# Patient Record
Sex: Female | Born: 1990 | Race: Black or African American | Hispanic: No | Marital: Single | Smoking: Former smoker
Health system: Southern US, Community
[De-identification: ages and names within clinical notes are randomized; demographics above are authoritative.]

## PROBLEM LIST (undated history)

## (undated) ENCOUNTER — Inpatient Hospital Stay (HOSPITAL_COMMUNITY): Payer: Self-pay

## (undated) DIAGNOSIS — K589 Irritable bowel syndrome without diarrhea: Secondary | ICD-10-CM

## (undated) HISTORY — PX: NO PAST SURGERIES: SHX2092

---
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
## 2013-01-04 ENCOUNTER — Other Ambulatory Visit: Payer: Self-pay | Admitting: Family Medicine

## 2013-01-04 DIAGNOSIS — Q2734 Arteriovenous malformation of renal vessel: Secondary | ICD-10-CM

## 2013-01-05 ENCOUNTER — Other Ambulatory Visit: Payer: Self-pay | Admitting: Family Medicine

## 2013-01-05 ENCOUNTER — Inpatient Hospital Stay
Admission: RE | Admit: 2013-01-05 | Discharge: 2013-01-05 | Disposition: A | Discharge status: Home / Self Care | Payer: Self-pay | Source: Ambulatory Visit | Attending: Family Medicine | Admitting: Family Medicine

## 2013-01-05 DIAGNOSIS — Q2734 Arteriovenous malformation of renal vessel: Secondary | ICD-10-CM

## 2015-02-24 ENCOUNTER — Emergency Department (HOSPITAL_COMMUNITY): Payer: Managed Care, Other (non HMO)

## 2015-02-24 ENCOUNTER — Emergency Department (HOSPITAL_COMMUNITY)
Admission: EM | Admit: 2015-02-24 | Discharge: 2015-02-24 | Disposition: A | Discharge status: Home / Self Care | Payer: Managed Care, Other (non HMO) | Attending: Emergency Medicine | Admitting: Emergency Medicine

## 2015-02-24 ENCOUNTER — Encounter (HOSPITAL_COMMUNITY): Payer: Self-pay

## 2015-02-24 DIAGNOSIS — R42 Dizziness and giddiness: Secondary | ICD-10-CM | POA: Diagnosis not present

## 2015-02-24 DIAGNOSIS — R102 Pelvic and perineal pain: Secondary | ICD-10-CM | POA: Diagnosis not present

## 2015-02-24 DIAGNOSIS — M545 Low back pain: Secondary | ICD-10-CM | POA: Diagnosis not present

## 2015-02-24 DIAGNOSIS — Z3202 Encounter for pregnancy test, result negative: Secondary | ICD-10-CM | POA: Insufficient documentation

## 2015-02-24 DIAGNOSIS — N941 Unspecified dyspareunia: Secondary | ICD-10-CM | POA: Insufficient documentation

## 2015-02-24 DIAGNOSIS — R110 Nausea: Secondary | ICD-10-CM | POA: Diagnosis not present

## 2015-02-24 DIAGNOSIS — R350 Frequency of micturition: Secondary | ICD-10-CM | POA: Insufficient documentation

## 2015-02-24 DIAGNOSIS — Z720 Tobacco use: Secondary | ICD-10-CM | POA: Diagnosis not present

## 2015-02-24 DIAGNOSIS — R1030 Lower abdominal pain, unspecified: Secondary | ICD-10-CM | POA: Diagnosis present

## 2015-02-24 LAB — COMPREHENSIVE METABOLIC PANEL
ALT: 13 U/L — ABNORMAL LOW (ref 14–54)
AST: 20 U/L (ref 15–41)
Albumin: 3.8 g/dL (ref 3.5–5.0)
Alkaline Phosphatase: 57 U/L (ref 38–126)
Anion gap: 7 (ref 5–15)
BUN: 10 mg/dL (ref 6–20)
CO2: 23 mmol/L (ref 22–32)
Calcium: 9.1 mg/dL (ref 8.9–10.3)
Chloride: 103 mmol/L (ref 101–111)
Creatinine, Ser: 0.64 mg/dL (ref 0.44–1.00)
GFR calc Af Amer: >60 mL/min (ref >60)
GFR calc non Af Amer: >60 mL/min (ref >60)
Glucose, Bld: 94 mg/dL (ref 65–99)
Potassium: 4.1 mmol/L (ref 3.5–5.1)
Sodium: 133 mmol/L — ABNORMAL LOW (ref 135–145)
Total Bilirubin: 0.7 mg/dL (ref 0.3–1.2)
Total Protein: 6.8 g/dL (ref 6.5–8.1)

## 2015-02-24 LAB — URINALYSIS, ROUTINE W REFLEX MICROSCOPIC
Bilirubin Urine: NEGATIVE
Glucose, UA: NEGATIVE mg/dL
Hgb urine dipstick: NEGATIVE
Ketones, ur: NEGATIVE mg/dL
Leukocytes, UA: NEGATIVE
Nitrite: NEGATIVE
Protein, ur: NEGATIVE mg/dL
Specific Gravity, Urine: 1.022 (ref 1.005–1.030)
Urobilinogen, UA: 0.2 mg/dL (ref 0.0–1.0)
pH: 6.5 (ref 5.0–8.0)

## 2015-02-24 LAB — WET PREP, GENITAL
TRICH WET PREP: NONE SEEN
YEAST WET PREP: NONE SEEN

## 2015-02-24 LAB — CBC
HCT: 39.5 % (ref 36.0–46.0)
Hemoglobin: 13 g/dL (ref 12.0–15.0)
MCH: 28 pg (ref 26.0–34.0)
MCHC: 32.9 g/dL (ref 30.0–36.0)
MCV: 85.1 fL (ref 78.0–100.0)
Platelets: 244 10*3/uL (ref 150–400)
RBC: 4.64 MIL/uL (ref 3.87–5.11)
RDW: 13.5 % (ref 11.5–15.5)
WBC: 6.9 10*3/uL (ref 4.0–10.5)

## 2015-02-24 LAB — POC URINE PREG, ED: Preg Test, Ur: NEGATIVE

## 2015-02-24 MED ORDER — KETOROLAC TROMETHAMINE 60 MG/2ML IM SOLN
30.0000 mg | Freq: Once | INTRAMUSCULAR | Status: AC
  Administered 2015-02-24: 30 mg via INTRAMUSCULAR
  Filled 2015-02-24: qty 2

## 2015-02-24 MED ORDER — ONDANSETRON 4 MG PO TBDP
4.0000 mg | ORAL_TABLET | Freq: Once | ORAL | Status: AC
Start: 2015-02-24 — End: 2015-02-24
  Administered 2015-02-24: 4 mg via ORAL
  Filled 2015-02-24: qty 1

## 2015-02-24 MED ORDER — PROMETHAZINE HCL 12.5 MG PO TABS: 12.5000 mg | ORAL_TABLET | Freq: Four times a day (QID) | ORAL | Status: DC | PRN

## 2015-02-24 MED ORDER — NAPROXEN 500 MG PO TABS: 500.0000 mg | ORAL_TABLET | Freq: Two times a day (BID) | ORAL | Status: DC

## 2015-02-24 NOTE — ED Notes (Signed)
The pt has been set-up for a pelvic

## 2015-02-24 NOTE — Discharge Instructions (Signed)
[facility] follow up.

## 2015-02-24 NOTE — ED Notes (Signed)
Pt up to the br urine collected and sent to lab  She is desperately attempting to get in touch with her boyfriend that she cannot locate

## 2015-02-24 NOTE — ED Notes (Signed)
[facility] and reports having lower pelvic pain since the insertion.  In the last month she has had increased pain and also having pain during sex sharp on her lt. Side.  Pt. Denies any v/d  .  Pt. Does have periods of intermittent nausea denies any at the present time.  Pt. Denies any vaginal bleeding or unusual discharge or odor. LMP was October 15th  201 6

## 2015-02-24 NOTE — ED Notes (Signed)
The pt is also c/o a headache  Hx of the same she took tylenol this am    Minimal relief

## 2015-02-24 NOTE — ED Notes (Signed)
She feels better 

## 2015-02-24 NOTE — ED Provider Notes (Signed)
CSN: [id number]   Arrival date & time 02/24/15 1447  History  By signing my name below, I, Bethel Born, attest that this documentation has been prepared under the direction and in the presence of Kerrie Buffalo, NP. Electronically Signed: Bethel Born, ED Scribe. 02/24/2015. 3:48 PM. Chief Complaint  Patient presents with  . Abdominal Pain    HPI Patient is a 24 y.o. female presenting with abdominal pain. The history is provided by the patient.  Abdominal Pain Pain location:  Suprapubic Pain quality: cramping   Pain radiates to:  Does not radiate Pain severity:  Severe Onset quality:  Gradual Duration: almost 1 year. Timing:  Constant Progression:  Worsening Relieved by:  Acetaminophen Ineffective treatments:  None tried Associated symptoms: nausea   Associated symptoms: no chills, no diarrhea, no dysuria, no fever, no hematemesis, no hematochezia, no hematuria, no melena, no shortness of breath, no sore throat, no vaginal bleeding, no vaginal discharge and no vomiting   Risk factors: not elderly, not obese and not pregnant    Lori Payne is a 24 y.o. female who presents to the Emergency Department complaining of worsening lower abdominal pain with onset nearly 1 year ago. The pain worsened in the last couple months. The pain is described as cramping like contractions and rated 7/10 in severity. Tylenol provided some pain relief at home.  Associated symptoms include lower back pain, dyspareunia, nausea, light-headedness, and increased urinary frequency. Pt denies vaginal discharge or bleeding, vomiting, fever, chills, dysuria. G1P1. Pt has had a Mirena IUD for 2 years. She notes having near-daily migraines since the IUD was placed in [place]. Her last pap smear was 4 months ago in [place]. At that time it was noted that the IUD was slightly turned. Pt states that she has been able to feel the strings on the device.  History reviewed. No pertinent past medical history.  History  reviewed. No pertinent past surgical history.  No family history on file.  Social History  Substance Use Topics  . Smoking status: Current Every Day Smoker    Types: Cigarettes  . Smokeless tobacco: None  . Alcohol Use: Yes     Comment: social     Review of Systems  Constitutional: Negative for fever and chills.  HENT: Negative for sore throat.   Respiratory: Negative for shortness of breath.   Gastrointestinal: Positive for nausea and abdominal pain. Negative for vomiting, diarrhea, melena, hematochezia and hematemesis.  Genitourinary: Positive for frequency, pelvic pain and dyspareunia. Negative for dysuria, urgency, hematuria, vaginal bleeding and vaginal discharge.  All other systems reviewed and are negative.   Home Medications   Prior to Admission medications   Medication Sig Start Date End Date Taking? Authorizing Provider  naproxen (NAPROSYN) 500 MG tablet Take 1 tablet (500 mg total) by mouth 2 (two) times daily. 02/24/15   Hope Orlene Och, NP  promethazine (PHENERGAN) 12.5 MG tablet Take 1 tablet (12.5 mg total) by mouth every 6 (six) hours as needed for nausea or vomiting. 02/24/15   Hope Orlene Och, NP    Allergies  Hydrocodone and Dilaudid  Triage Vitals: BP 109/58 mmHg  Pulse 99  Temp(Src) 97.9 F (36.6 C) (Oral)  Resp 20  Ht 5\' 7"  (1.702 m)  Wt 155 lb (70.308 kg)  BMI 24.27 kg/m2  SpO2 99%  LMP 02/10/2015 (LMP Unknown)  Physical Exam  Constitutional: She is oriented to person, place, and time. She appears well-developed and well-nourished.  HENT:  Head: Normocephalic and atraumatic.  Eyes: EOM  are normal.  Neck: Neck supple.  Cardiovascular: Normal rate and regular rhythm.   Pulmonary/Chest: Effort normal and breath sounds normal.  Abdominal: Soft. Bowel sounds are normal. She exhibits no distension. There is no rebound and no guarding.  Mildly tender with deep palpation of lower abdomen.  Genitourinary:  External genitalia without lesions, white d/c  vaginal vault, IUD string visible. Mild CMT, no adnexal tenderness or mass palpated, uterus without palpable enlargement.   Musculoskeletal: Normal range of motion.  Neurological: She is alert and oriented to person, place, and time. No cranial nerve deficit.  Skin: Skin is warm and dry.  Psychiatric: She has a normal mood and affect. Her behavior is normal.  Nursing note and vitals reviewed.   ED Course  Procedures  DIAGNOSTIC STUDIES: Oxygen Saturation is 99% on RA,  normal by my interpretation.    COORDINATION OF CARE: 3:27 PM Discussed treatment plan which includes pelvic exam, lab work, Toradol and Zofran  with pt at bedside and pt agreed to the plan.  Labs Review-  Results for orders placed or performed during the hospital encounter of 02/24/15 (from the past 24 hour(s))  CBC     Status: None   Collection Time: 02/24/15  3:32 PM  Result Value Ref Range   WBC 6.9 4.0 - 10.5 K/uL   RBC 4.64 3.87 - 5.11 MIL/uL   Hemoglobin 13.0 12.0 - 15.0 g/dL   HCT 96.039.5 45.436.0 - 09.846.0 %   MCV 85.1 78.0 - 100.0 fL   MCH 28.0 26.0 - 34.0 pg   MCHC 32.9 30.0 - 36.0 g/dL   RDW 11.913.5 14.711.5 - 82.915.5 %   Platelets 244 150 - 400 K/uL  Comprehensive metabolic panel     Status: Abnormal   Collection Time: 02/24/15  3:32 PM  Result Value Ref Range   Sodium 133 (L) 135 - 145 mmol/L   Potassium 4.1 3.5 - 5.1 mmol/L   Chloride 103 101 - 111 mmol/L   CO2 23 22 - 32 mmol/L   Glucose, Bld 94 65 - 99 mg/dL   BUN 10 6 - 20 mg/dL   Creatinine, Ser 5.620.64 0.44 - 1.00 mg/dL   Calcium 9.1 8.9 - 13.010.3 mg/dL   Total Protein 6.8 6.5 - 8.1 g/dL   Albumin 3.8 3.5 - 5.0 g/dL   AST 20 15 - 41 U/L   ALT 13 (L) 14 - 54 U/L   Alkaline Phosphatase 57 38 - 126 U/L   Total Bilirubin 0.7 0.3 - 1.2 mg/dL   GFR calc non Af Amer >60 >60 mL/min   GFR calc Af Amer >60 >60 mL/min   Anion gap 7 5 - 15  Urinalysis, Routine w reflex microscopic (not [facility])     Status: None   Collection Time: 02/24/15  4:00 PM  Result Value Ref  Range   Color, Urine YELLOW YELLOW   APPearance CLEAR CLEAR   Specific Gravity, Urine 1.022 1.005 - 1.030   pH 6.5 5.0 - 8.0   Glucose, UA NEGATIVE NEGATIVE mg/dL   Hgb urine dipstick NEGATIVE NEGATIVE   Bilirubin Urine NEGATIVE NEGATIVE   Ketones, ur NEGATIVE NEGATIVE mg/dL   Protein, ur NEGATIVE NEGATIVE mg/dL   Urobilinogen, UA 0.2 0.0 - 1.0 mg/dL   Nitrite NEGATIVE NEGATIVE   Leukocytes, UA NEGATIVE NEGATIVE  POC urine preg, ED (not [facility])     Status: None   Collection Time: 02/24/15  4:06 PM  Result Value Ref Range   Preg Test,  Ur NEGATIVE NEGATIVE  Wet prep, genital     Status: Abnormal   Collection Time: 02/24/15  4:58 PM  Result Value Ref Range   Yeast Wet Prep HPF POC NONE SEEN NONE SEEN   Trich, Wet Prep NONE SEEN NONE SEEN   Clue Cells Wet Prep HPF POC MANY (A) NONE SEEN   WBC, Wet Prep HPF POC MANY (A) NONE SEEN     Imaging Review US Transvaginal Non-ob  02/24/2015  CLINICAL DATA:  Bilateral pelvic pain for 1 year. IUD placed 2 years prior. EXAM: TRANSABDOMINAL AND TRANSVAGINAL ULTRASOUND OF PELVIS TECHNIQUE: Both transabdominal and transvaginal ultrasound examinations of the pelvis were performed. Transabdominal technique was performed for global imaging of the pelvis including uterus, ovaries, adnexal regions, and pelvic cul-de-sac. It was necessary to proceed with endovaginal exam following the transabdominal exam to visualize the endometrium. COMPARISON:  None FINDINGS: Uterus Measurements: Retroverted uterus is normal in size and echogenicity measuring 8.9 x 1.3 x 5.1 cm. There is an IUD in expected location in the uterus. No fibroids or other mass visualized. Endometrium Thickness: Normal in thickness at 6 mm.  IUD noted.  Right ovary Measurements: Normal in size at 4.8 x 2.3 x 2.7 cm. Multiple small follicles are present. A single dominant follicle. Left ovary Measurements: Normal size and echogenicity measuring 3.7 x 1.9 x 2.2 cm. Multiple small follicles. Other  findings There multiple prominent venous vessels within the pelvis. IMPRESSION: 1. Normal uterus with IUD in expected location. 2. Normal endometrium. 3. Normal ovaries. 4. Multiple venous structures in the pelvis. While nonspecific, in patient with chronic pelvic pain the patient could be evaluated for pelvic vascular congestion syndrome. Electronically Signed   By: Genevive Bi M.D.   On: 02/24/2015 17:56   US Pelvis Complete  02/24/2015  CLINICAL DATA:  Bilateral pelvic pain for 1 year. IUD placed 2 years prior. EXAM: TRANSABDOMINAL AND TRANSVAGINAL ULTRASOUND OF PELVIS TECHNIQUE: Both transabdominal and transvaginal ultrasound examinations of the pelvis were performed. Transabdominal technique was performed for global imaging of the pelvis including uterus, ovaries, adnexal regions, and pelvic cul-de-sac. It was necessary to proceed with endovaginal exam following the transabdominal exam to visualize the endometrium. COMPARISON:  None FINDINGS: Uterus Measurements: Retroverted uterus is normal in size and echogenicity measuring 8.9 x 1.3 x 5.1 cm. There is an IUD in expected location in the uterus. No fibroids or other mass visualized. Endometrium Thickness: Normal in thickness at 6 mm.  IUD noted.  Right ovary Measurements: Normal in size at 4.8 x 2.3 x 2.7 cm. Multiple small follicles are present. A single dominant follicle. Left ovary Measurements: Normal size and echogenicity measuring 3.7 x 1.9 x 2.2 cm. Multiple small follicles. Other findings There multiple prominent venous vessels within the pelvis. IMPRESSION: 1. Normal uterus with IUD in expected location. 2. Normal endometrium. 3. Normal ovaries. 4. Multiple venous structures in the pelvis. While nonspecific, in patient with chronic pelvic pain the patient could be evaluated for pelvic vascular congestion syndrome. Electronically Signed   By: Genevive Bi M.D.   On: 02/24/2015 17:56    MDM  24 y.o. female with chronic pelvic pain  stable for d/c without TOA, torsion, acute abdomen and does not appear toxic or in any distress. Headache, nausea and pelvic pain improved significantly with Toradol and phenergan. Will treat with Naprosyn and phenergan and she will follow up with [facility]. Discussed with the patient and all questioned fully answered.   Final diagnoses:  Pelvic  pain in female   I personally performed the services described in this documentation, which was scribed in my presence. The recorded information has been reviewed and is accurate.      [address] [place], [place] 02/24/15 2307  Marily Memos, MD 02/24/15 [phone]

## 2015-02-25 LAB — HIV ANTIBODY (ROUTINE TESTING W REFLEX): HIV Screen 4th Generation wRfx: NONREACTIVE

## 2015-02-25 LAB — RPR: RPR: NONREACTIVE

## 2015-02-27 LAB — GC/CHLAMYDIA PROBE AMP (~~LOC~~) NOT AT ARMC
CHLAMYDIA, DNA PROBE: NEGATIVE
NEISSERIA GONORRHEA: NEGATIVE

## 2015-03-08 ENCOUNTER — Ambulatory Visit (INDEPENDENT_AMBULATORY_CARE_PROVIDER_SITE_OTHER): Payer: Managed Care, Other (non HMO) | Admitting: Obstetrics & Gynecology

## 2015-03-08 ENCOUNTER — Encounter: Payer: Self-pay | Admitting: Obstetrics & Gynecology

## 2015-03-08 VITALS — BP 109/47 | HR 58 | Temp 98.1°F | Ht 67.0 in | Wt 166.0 lb

## 2015-03-08 DIAGNOSIS — G44209 Tension-type headache, unspecified, not intractable: Secondary | ICD-10-CM

## 2015-03-08 DIAGNOSIS — R51 Headache: Secondary | ICD-10-CM

## 2015-03-08 DIAGNOSIS — Z30432 Encounter for removal of intrauterine contraceptive device: Secondary | ICD-10-CM

## 2015-03-08 DIAGNOSIS — R519 Headache, unspecified: Secondary | ICD-10-CM | POA: Insufficient documentation

## 2015-03-08 NOTE — Progress Notes (Signed)
Patient ID: Lori Payne, female   DOB: Aug 26, 1990, 24 y.o.   MRN: [id number]  Chief Complaint  Patient presents with  . IUD removal  frequent headache and pelvic pain   HPI Lori HarrowSabrina Ellinger is a 24 y.o. female.  G1P1001 Patient's last menstrual period was 03/06/2015. [facility] but wants Mirena removed due to cc above  HPI  No past medical history on file.  No past surgical history on file.  No family history on file.  Social History Social History  Substance Use Topics  . Smoking status: Current Every Day Smoker    Types: Cigarettes  . Smokeless tobacco: None  . Alcohol Use: Yes     Comment: social    Allergies  Allergen Reactions  . Hydrocodone Nausea Only    nausea  . Dilaudid [Hydromorphone Hcl] Itching    Current Outpatient Prescriptions  Medication Sig Dispense Refill  . Multiple Vitamins-Minerals (HAIR SKIN AND NAILS FORMULA PO) Take 1 tablet by mouth daily.     No current facility-administered medications for this visit.    Review of Systems Review of Systems  Genitourinary: Positive for menstrual problem and pelvic pain. Negative for vaginal discharge.  Neurological: Positive for headaches.    Blood pressure 109/47, pulse 58, temperature 98.1 F (36.7 C), temperature source Oral, height 5\' 7"  (1.702 m), weight 166 lb (75.297 kg), last menstrual period 03/06/2015.  Physical Exam Physical Exam  Constitutional: She is oriented to person, place, and time. She appears well-developed. No distress.  Genitourinary: Vagina normal. No vaginal discharge found.  String grasped and Mirena removed intact  Neurological: She is alert and oriented to person, place, and time.  Skin: Skin is warm and dry.  Psychiatric: She has a normal mood and affect. Her behavior is normal.      Assessment    Requests mirena removed due to side effects, done without difficulty     Plan    Contraceptive methods information, abstain or use condoms and  spermacide        Diante Barley 03/08/2015, 2:31 PM

## 2015-03-08 NOTE — Patient Instructions (Signed)
Contraception Choices Contraception (birth control) is the use of any methods or devices to prevent pregnancy. Below are some methods to help avoid pregnancy. HORMONAL METHODS   Contraceptive implant. This is a thin, plastic tube containing progesterone hormone. It does not contain estrogen hormone. Your health care provider inserts the tube in the inner part of the upper arm. The tube can remain in place for up to 3 years. After 3 years, the implant must be removed. The implant prevents the ovaries from releasing an egg (ovulation), thickens the cervical mucus to prevent sperm from entering the uterus, and thins the lining of the inside of the uterus.  Progesterone-only injections. These injections are given every 3 months by your health care provider to prevent pregnancy. This synthetic progesterone hormone stops the ovaries from releasing eggs. It also thickens cervical mucus and changes the uterine lining. This makes it harder for sperm to survive in the uterus.  Birth control pills. These pills contain estrogen and progesterone hormone. They work by preventing the ovaries from releasing eggs (ovulation). They also cause the cervical mucus to thicken, preventing the sperm from entering the uterus. Birth control pills are prescribed by a health care provider.Birth control pills can also be used to treat heavy periods.  Minipill. This type of birth control pill contains only the progesterone hormone. They are taken every day of each month and must be prescribed by your health care provider.  Birth control patch. The patch contains hormones similar to those in birth control pills. It must be changed once a week and is prescribed by a health care provider.  Vaginal ring. The ring contains hormones similar to those in birth control pills. It is left in the vagina for 3 weeks, removed for 1 week, and then a new one is put back in place. The patient must be comfortable inserting and removing the ring  from the vagina.A health care provider's prescription is necessary.  Emergency contraception. Emergency contraceptives prevent pregnancy after unprotected sexual intercourse. This pill can be taken right after sex or up to 5 days after unprotected sex. It is most effective the sooner you take the pills after having sexual intercourse. Most emergency contraceptive pills are available without a prescription. Check with your pharmacist. Do not use emergency contraception as your only form of birth control. BARRIER METHODS   Female condom. This is a thin sheath (latex or rubber) that is worn over the penis during sexual intercourse. It can be used with spermicide to increase effectiveness.  Female condom. This is a soft, loose-fitting sheath that is put into the vagina before sexual intercourse.  Diaphragm. This is a soft, latex, dome-shaped barrier that must be fitted by a health care provider. It is inserted into the vagina, along with a spermicidal jelly. It is inserted before intercourse. The diaphragm should be left in the vagina for 6 to 8 hours after intercourse.  Cervical cap. This is a round, soft, latex or plastic cup that fits over the cervix and must be fitted by a health care provider. The cap can be left in place for up to 48 hours after intercourse.  Sponge. This is a soft, circular piece of polyurethane foam. The sponge has spermicide in it. It is inserted into the vagina after wetting it and before sexual intercourse.  Spermicides. These are chemicals that kill or block sperm from entering the cervix and uterus. They come in the form of creams, jellies, suppositories, foam, or tablets. They do not require a   prescription. They are inserted into the vagina with an applicator before having sexual intercourse. The process must be repeated every time you have sexual intercourse. INTRAUTERINE CONTRACEPTION  Intrauterine device (IUD). This is a T-shaped device that is put in a woman's uterus  during a menstrual period to prevent pregnancy. There are 2 types:  Copper IUD. This type of IUD is wrapped in copper wire and is placed inside the uterus. Copper makes the uterus and fallopian tubes produce a fluid that kills sperm. It can stay in place for 10 years.  Hormone IUD. This type of IUD contains the hormone progestin (synthetic progesterone). The hormone thickens the cervical mucus and prevents sperm from entering the uterus, and it also thins the uterine lining to prevent implantation of a fertilized egg. The hormone can weaken or kill the sperm that get into the uterus. It can stay in place for 3-5 years, depending on which type of IUD is used. PERMANENT METHODS OF CONTRACEPTION  Female tubal ligation. This is when the woman's fallopian tubes are surgically sealed, tied, or blocked to prevent the egg from traveling to the uterus.  Hysteroscopic sterilization. This involves placing a small coil or insert into each fallopian tube. Your doctor uses a technique called hysteroscopy to do the procedure. The device causes scar tissue to form. This results in permanent blockage of the fallopian tubes, so the sperm cannot fertilize the egg. It takes about 3 months after the procedure for the tubes to become blocked. You must use another form of birth control for these 3 months.  Female sterilization. This is when the female has the tubes that carry sperm tied off (vasectomy).This blocks sperm from entering the vagina during sexual intercourse. After the procedure, the man can still ejaculate fluid (semen). NATURAL PLANNING METHODS  Natural family planning. This is not having sexual intercourse or using a barrier method (condom, diaphragm, cervical cap) on days the woman could become pregnant.  Calendar method. This is keeping track of the length of each menstrual cycle and identifying when you are fertile.  Ovulation method. This is avoiding sexual intercourse during ovulation.  Symptothermal  method. This is avoiding sexual intercourse during ovulation, using a thermometer and ovulation symptoms.  Post-ovulation method. This is timing sexual intercourse after you have ovulated. Regardless of which type or method of contraception you choose, it is important that you use condoms to protect against the transmission of sexually transmitted infections (STIs). Talk with your health care provider about which form of contraception is most appropriate for you.   This information is not intended to replace advice given to you by your health care provider. Make sure you discuss any questions you have with your health care provider.   Document Released: 04/15/2005 Document Revised: 04/20/2013 Document Reviewed: 10/08/2012 Elsevier Interactive Patient Education 2016 [employer]  

## 2015-04-17 ENCOUNTER — Encounter (HOSPITAL_COMMUNITY): Payer: Self-pay | Admitting: Cardiology

## 2015-04-17 ENCOUNTER — Emergency Department (HOSPITAL_COMMUNITY)
Admission: EM | Admit: 2015-04-17 | Discharge: 2015-04-17 | Disposition: A | Discharge status: Home / Self Care | Payer: Managed Care, Other (non HMO) | Attending: Emergency Medicine | Admitting: Emergency Medicine

## 2015-04-17 ENCOUNTER — Emergency Department (HOSPITAL_COMMUNITY): Payer: Managed Care, Other (non HMO)

## 2015-04-17 DIAGNOSIS — O468X1 Other antepartum hemorrhage, first trimester: Secondary | ICD-10-CM

## 2015-04-17 DIAGNOSIS — Z3A01 Less than 8 weeks gestation of pregnancy: Secondary | ICD-10-CM | POA: Insufficient documentation

## 2015-04-17 DIAGNOSIS — O99331 Smoking (tobacco) complicating pregnancy, first trimester: Secondary | ICD-10-CM | POA: Insufficient documentation

## 2015-04-17 DIAGNOSIS — R102 Pelvic and perineal pain: Secondary | ICD-10-CM | POA: Insufficient documentation

## 2015-04-17 DIAGNOSIS — O9989 Other specified diseases and conditions complicating pregnancy, childbirth and the puerperium: Secondary | ICD-10-CM | POA: Insufficient documentation

## 2015-04-17 DIAGNOSIS — Z3490 Encounter for supervision of normal pregnancy, unspecified, unspecified trimester: Secondary | ICD-10-CM

## 2015-04-17 DIAGNOSIS — O418X10 Other specified disorders of amniotic fluid and membranes, first trimester, not applicable or unspecified: Secondary | ICD-10-CM | POA: Diagnosis not present

## 2015-04-17 DIAGNOSIS — Z79899 Other long term (current) drug therapy: Secondary | ICD-10-CM | POA: Insufficient documentation

## 2015-04-17 DIAGNOSIS — F17210 Nicotine dependence, cigarettes, uncomplicated: Secondary | ICD-10-CM | POA: Diagnosis not present

## 2015-04-17 DIAGNOSIS — O26899 Other specified pregnancy related conditions, unspecified trimester: Secondary | ICD-10-CM

## 2015-04-17 LAB — URINALYSIS, ROUTINE W REFLEX MICROSCOPIC
Bilirubin Urine: NEGATIVE
GLUCOSE, UA: NEGATIVE mg/dL
HGB URINE DIPSTICK: NEGATIVE
Ketones, ur: NEGATIVE mg/dL
LEUKOCYTES UA: NEGATIVE
Nitrite: NEGATIVE
PH: 6 (ref 5.0–8.0)
PROTEIN: NEGATIVE mg/dL
Specific Gravity, Urine: 1.025 (ref 1.005–1.030)

## 2015-04-17 LAB — CBC WITH DIFFERENTIAL/PLATELET
BASOS ABS: 0 10*3/uL (ref 0.0–0.1)
BASOS PCT: 0 %
Basophils Absolute: 0 10*3/uL (ref 0.0–0.1)
Basophils Relative: 0 %
Eosinophils Absolute: 0.1 10*3/uL (ref 0.0–0.7)
Eosinophils Absolute: 0.2 10*3/uL (ref 0.0–0.7)
Eosinophils Relative: 1 %
Eosinophils Relative: 2 %
HEMATOCRIT: 37.1 % (ref 36.0–46.0)
HEMATOCRIT: 39.1 % (ref 36.0–46.0)
HEMOGLOBIN: 12.1 g/dL (ref 12.0–15.0)
Hemoglobin: 12.8 g/dL (ref 12.0–15.0)
LYMPHS ABS: 1.3 10*3/uL (ref 0.7–4.0)
LYMPHS PCT: 14 %
Lymphocytes Relative: 30 %
Lymphs Abs: 3 10*3/uL (ref 0.7–4.0)
MCH: 29.2 pg (ref 26.0–34.0)
MCH: 28.2 pg (ref 26.0–34.0)
MCHC: 32.6 g/dL (ref 30.0–36.0)
MCHC: 32.7 g/dL (ref 30.0–36.0)
MCV: 89.4 fL (ref 78.0–100.0)
MCV: 86.1 fL (ref 78.0–100.0)
MONO ABS: 1 10*3/uL (ref 0.1–1.0)
MONO ABS: 0.8 10*3/uL (ref 0.1–1.0)
MONOS PCT: 11 %
Monocytes Relative: 8 %
NEUTROS ABS: 7.1 10*3/uL (ref 1.7–7.7)
NEUTROS ABS: 6 10*3/uL (ref 1.7–7.7)
NEUTROS PCT: 74 %
Neutrophils Relative %: 60 %
PLATELETS: 206 10*3/uL (ref 150–400)
Platelets: 151 10*3/uL (ref 150–400)
RBC: 4.15 MIL/uL (ref 3.87–5.11)
RBC: 4.54 MIL/uL (ref 3.87–5.11)
RDW: 14 % (ref 11.5–15.5)
RDW: 12.9 % (ref 11.5–15.5)
WBC: 9.5 10*3/uL (ref 4.0–10.5)
WBC: 10 10*3/uL (ref 4.0–10.5)

## 2015-04-17 LAB — BASIC METABOLIC PANEL
ANION GAP: 7 (ref 5–15)
BUN: 11 mg/dL (ref 6–20)
CALCIUM: 9.1 mg/dL (ref 8.9–10.3)
CO2: 22 mmol/L (ref 22–32)
Chloride: 106 mmol/L (ref 101–111)
Creatinine, Ser: 0.69 mg/dL (ref 0.44–1.00)
GLUCOSE: 80 mg/dL (ref 65–99)
Potassium: 4.4 mmol/L (ref 3.5–5.1)
SODIUM: 135 mmol/L (ref 135–145)

## 2015-04-17 LAB — WET PREP, GENITAL
SPERM: NONE SEEN
Trich, Wet Prep: NONE SEEN
Yeast Wet Prep HPF POC: NONE SEEN

## 2015-04-17 LAB — ABO/RH: ABO/RH(D): O POS

## 2015-04-17 LAB — HCG, QUANTITATIVE, PREGNANCY: hCG, Beta Chain, Quant, S: 4924 m[IU]/mL — ABNORMAL HIGH (ref <5)

## 2015-04-17 LAB — POC URINE PREG, ED: Preg Test, Ur: POSITIVE — AB

## 2015-04-17 LAB — OB RESULTS CONSOLE GC/CHLAMYDIA: Gonorrhea: NEGATIVE

## 2015-04-17 MED ORDER — PRENATAL 27-0.8 MG PO TABS: 1.0000 | ORAL_TABLET | Freq: Every day | ORAL | Status: DC

## 2015-04-17 NOTE — ED Notes (Signed)
Pt reports that she recently had an IUD removed and has been having pelvic pain since then. Denies any abnormal bleeding.

## 2015-04-17 NOTE — ED Provider Notes (Signed)
CSN: [id number]     Arrival date & time 04/17/15  1545 History  By signing my name below, I, Placido Sou, attest that this documentation has been prepared under the direction and in the presence of [place], PA-C. Electronically Signed: Placido Sou, ED Scribe. 04/17/2015. 5:07 PM.     Chief Complaint  Patient presents with  . Pelvic Pain   The history is provided by the patient. No language interpreter was used.    HPI Comments: Lori Payne is a 24 y.o. female who presents to the Emergency Department complaining of intermittent, mild, lower abd pain with onset on 11/9. Pt notes having an IUD removed on 11/9 at the [facility] due to experiencing associated migraines now noting that since removal she has been experiencing her current symptoms. She describes the pain as "needles". Pt notes associated increased urinary frequency. She denies prior abdominal surgery. Pt confirms being sexually active and notes 1x hx of pregnancy and denies any abnormalities, G1P1001. Pt denies taking any regular medications. She denies fevers, chills, body aches, SOB, CP, dysuria, bowel issues, vaginal bleeding and vaginal discharge.   History reviewed. No pertinent past medical history. History reviewed. No pertinent past surgical history. History reviewed. No pertinent family history. Social History  Substance Use Topics  . Smoking status: Current Every Day Smoker    Types: Cigarettes  . Smokeless tobacco: None  . Alcohol Use: Yes     Comment: social   OB History    Gravida Para Term Preterm AB TAB SAB Ectopic Multiple Living   0 0 0 0 0 0 1     Review of Systems  Constitutional: Negative for fever and chills.  Respiratory: Negative for shortness of breath.   Cardiovascular: Negative for chest pain.  Gastrointestinal: Positive for abdominal pain. Negative for diarrhea, constipation, blood in stool and anal bleeding.  Genitourinary: Positive for frequency and pelvic pain.  Negative for dysuria, hematuria, vaginal bleeding, vaginal discharge and menstrual problem.  Musculoskeletal: Negative for myalgias.  All other systems reviewed and are negative.  Allergies  Hydrocodone and Dilaudid  Home Medications   Prior to Admission medications   Medication Sig Start Date End Date Taking? Authorizing Provider  Multiple Vitamins-Minerals (HAIR SKIN AND NAILS FORMULA PO) Take 1 tablet by mouth daily.    Historical Provider, MD   BP 106/64 mmHg  Pulse 75  Temp(Src) 97.5 F (36.4 C) (Oral)  Resp 18  Ht  (1.702 m)  Wt 166 lb (75.297 kg)  BMI 25.99 kg/m2  SpO2 100% Physical Exam  Constitutional: She appears well-developed and well-nourished. No distress.  HENT:  Head: Normocephalic and atraumatic.  Neck: Neck supple.  Pulmonary/Chest: Effort normal.  Abdominal: Soft. She exhibits no distension and no mass. There is tenderness. There is no rebound and no guarding.  Tenderness across the lower abd  Genitourinary: Uterus is tender. Cervix exhibits no motion tenderness. Right adnexum displays no mass, no tenderness and no fullness. Left adnexum displays no mass, no tenderness and no fullness. No tenderness or bleeding in the vagina.  Cervix palpable to fingertip only, unable to visualize with speculum.  Small amount of white discharge in vagina  Neurological: She is alert.  Skin: She is not diaphoretic.  Nursing note and vitals reviewed.  ED Course  Procedures  DIAGNOSTIC STUDIES: Oxygen Saturation is 100% on RA, normal by my interpretation.    COORDINATION OF CARE: 5:04 PM Pt presents today due to mild pelvic pain. Discussed next  steps and results of her pregnancy test and pt agreed to the plan.   Labs Review Labs Reviewed  WET PREP, GENITAL - Abnormal; Notable for the following:    Clue Cells Wet Prep HPF POC PRESENT (*)    WBC, Wet Prep HPF POC FEW (*)    All other components within normal limits  URINALYSIS, ROUTINE W REFLEX MICROSCOPIC (NOT AT  [facility]) - Abnormal; Notable for the following:    APPearance HAZY (*)    All other components within normal limits  HCG, QUANTITATIVE, PREGNANCY - Abnormal; Notable for the following:    hCG, Beta Chain, Quant, S 4924 (*)    All other components within normal limits  POC URINE PREG, ED - Abnormal; Notable for the following:    Preg Test, Ur POSITIVE (*)    All other components within normal limits  CBC WITH DIFFERENTIAL/PLATELET  BASIC METABOLIC PANEL  CBC WITH DIFFERENTIAL/PLATELET  HIV ANTIBODY (ROUTINE TESTING)  HCG, QUANTITATIVE, PREGNANCY  CBC WITH DIFFERENTIAL/PLATELET  ABO/RH  GC/CHLAMYDIA PROBE AMP () NOT AT [facility]    Imaging Review US Ob Comp Less 14 Wks  04/17/2015  CLINICAL DATA:  Pelvic pain 1 month. Quantitative beta HCG 4,924. Estimated gestational age [redacted] weeks 3 days per LMP. EXAM: OBSTETRIC <14 WK [place] AND TRANSVAGINAL OB US TECHNIQUE: Both transabdominal and transvaginal ultrasound examinations were performed for complete evaluation of the gestation as well as the maternal uterus, adnexal regions, and pelvic cul-de-sac. Transvaginal technique was performed to assess early pregnancy. COMPARISON:  Ultrasound 02/24/2015 FINDINGS: Intrauterine gestational sac: Visualized/normal in shape. Yolk sac:  Not visualized. Embryo:  Not visualized. Cardiac Activity: Not visualized. Heart Rate: Not visualized.  Bpm MSD: 6.6  mm   5 w   3  d [place] EDC: 12/15/2015 Subchorionic hemorrhage: Small to moderate size subchorionic hemorrhage. Maternal uterus/adnexae: 1.2 cm corpus luteum right ovary. Ovaries are otherwise unremarkable. Trace free pelvic fluid. IMPRESSION: Intrauterine gestational sac with mean sac diameter compatible with estimated gestational age 271 weeks 3 days. No yolk sac or embryo visualized. Findings likely represent a normal early pregnancy. Recommend serial quantitative beta HCG and follow-up ultrasound 10-14 days. Small to moderate subchorionic hemorrhage. Electronically  Signed   By: Elberta Fortis M.D.   On: 04/17/2015 20:51   US Ob Transvaginal  04/17/2015  CLINICAL DATA:  Pelvic pain 1 month. Quantitative beta HCG 4,924. Estimated gestational age [redacted] weeks 3 days per LMP. EXAM: OBSTETRIC <14 WK [place] AND TRANSVAGINAL OB US TECHNIQUE: Both transabdominal and transvaginal ultrasound examinations were performed for complete evaluation of the gestation as well as the maternal uterus, adnexal regions, and pelvic cul-de-sac. Transvaginal technique was performed to assess early pregnancy. COMPARISON:  Ultrasound 02/24/2015 FINDINGS: Intrauterine gestational sac: Visualized/normal in shape. Yolk sac:  Not visualized. Embryo:  Not visualized. Cardiac Activity: Not visualized. Heart Rate: Not visualized.  Bpm MSD: 6.6  mm   5 w   3  d [place] EDC: 12/15/2015 Subchorionic hemorrhage: Small to moderate size subchorionic hemorrhage. Maternal uterus/adnexae: 1.2 cm corpus luteum right ovary. Ovaries are otherwise unremarkable. Trace free pelvic fluid. IMPRESSION: Intrauterine gestational sac with mean sac diameter compatible with estimated gestational age 271 weeks 3 days. No yolk sac or embryo visualized. Findings likely represent a normal early pregnancy. Recommend serial quantitative beta HCG and follow-up ultrasound 10-14 days. Small to moderate subchorionic hemorrhage. Electronically Signed   By: Elberta Fortis M.D.   On: 04/17/2015 20:51   I have personally reviewed and evaluated these lab  results as part of my medical decision-making.   EKG Interpretation None      MDM   Final diagnoses:  Pelvic pain during pregnancy  Intrauterine pregnancy  Subchorionic hemorrhage in first trimester    Afebrile, nontoxic patient with lower abdominal pain x approximately 5 weeks with removal of IUD.  Found to be pregnant, approximately 5 weeks.  Intrauterine pregnancy confirmed.  Rh positive.  No vaginal bleeding.  No change in lower abdominal pain x 5 weeks, not worsening.  Pt [facility] and would like to return there for her care. Labs otherwise unremarkable.  Wet prep with clue cells but pt asymptomatic and clinically her discharge appears physiologic.  Pt discharged without my telling her about the subchorionic hemorrhage finding but I was able to call and speak with her about it, advised her to follow up as we previously discussed.  D/C home with prenatal vitamins, OB follow up.  Discussed result, findings, treatment, and follow up  with patient.  Pt given return precautions.  Pt verbalizes understanding and agrees with plan.       I personally performed the services described in this documentation, which was scribed in my presence. The recorded information has been reviewed and is accurate.    Trixie Dredgemily Jori Thrall, PA-C 04/17/15 2216  Trixie Dredgemily Lamond Glantz, PA-C 04/17/15 2229  Lavera Guiseana Duo Liu, MD 04/18/15 [phone]

## 2015-04-17 NOTE — Discharge Instructions (Signed)
Read the information below.  You may return to the Emergency Department at any time for worsening condition or any new symptoms that concern you.  If you develop high fevers, worsening abdominal pain, uncontrolled vomiting, or are unable to tolerate fluids by mouth, return to the ER for a recheck.   Please follow up with your OBGYN.    Abdominal Pain During Pregnancy Abdominal pain is common in pregnancy. Most of the time, it does not cause harm. There are many causes of abdominal pain. Some causes are more serious than others. Some of the causes of abdominal pain in pregnancy are easily diagnosed. Occasionally, the diagnosis takes time to understand. Other times, the cause is not determined. Abdominal pain can be a sign that something is very wrong with the pregnancy, or the pain may have nothing to do with the pregnancy at all. For this reason, always tell your health care provider if you have any abdominal discomfort. HOME CARE INSTRUCTIONS  Monitor your abdominal pain for any changes. The following actions may help to alleviate any discomfort you are experiencing:  Do not have sexual intercourse or put anything in your vagina until your symptoms go away completely.  Get plenty of rest until your pain improves.  Drink clear fluids if you feel nauseous. Avoid solid food as long as you are uncomfortable or nauseous.  Only take over-the-counter or prescription medicine as directed by your health care provider.  Keep all follow-up appointments with your health care provider. SEEK IMMEDIATE MEDICAL CARE IF:  You are bleeding, leaking fluid, or passing tissue from the vagina.  You have increasing pain or cramping.  You have persistent vomiting.  You have painful or bloody urination.  You have a fever.  You notice a decrease in your baby's movements.  You have extreme weakness or feel faint.  You have shortness of breath, with or without abdominal pain.  You develop a severe headache  with abdominal pain.  You have abnormal vaginal discharge with abdominal pain.  You have persistent diarrhea.  You have abdominal pain that continues even after rest, or gets worse. MAKE SURE YOU:   Understand these instructions.  Will watch your condition.  Will get help right away if you are not doing well or get worse.   This information is not intended to replace advice given to you by your health care provider. Make sure you discuss any questions you have with your health care provider.   Document Released: 04/15/2005 Document Revised: 02/03/2013 Document Reviewed: 11/12/2012 Elsevier Interactive Patient Education [employer]2016 Elsevier Inc.

## 2015-04-18 ENCOUNTER — Telehealth: Payer: Self-pay | Admitting: *Deleted

## 2015-04-18 DIAGNOSIS — Z3490 Encounter for supervision of normal pregnancy, unspecified, unspecified trimester: Secondary | ICD-10-CM

## 2015-04-18 LAB — GC/CHLAMYDIA PROBE AMP (~~LOC~~) NOT AT ARMC
Chlamydia: NEGATIVE
NEISSERIA GONORRHEA: NEGATIVE

## 2015-04-18 LAB — HIV ANTIBODY (ROUTINE TESTING W REFLEX): HIV SCREEN 4TH GENERATION: NONREACTIVE

## 2015-04-18 NOTE — Telephone Encounter (Signed)
Pt left message stating that she needs to schedule another ultrasound due to "hemorrhage bleeding". Per chart review, pt was seen @ [place] on 12/19 - see ultrasound report.

## 2015-04-19 NOTE — Telephone Encounter (Signed)
U/s scheduled for 1/4 @ 11am. Called patient stating I am returning her phone call and informed her of appt. Patient verbalized understanding & had no questions

## 2015-04-27 ENCOUNTER — Inpatient Hospital Stay (HOSPITAL_COMMUNITY)
Admission: AD | Admit: 2015-04-27 | Discharge: 2015-04-27 | Disposition: A | Discharge status: Home / Self Care | Payer: Managed Care, Other (non HMO) | Source: Ambulatory Visit | Attending: Obstetrics & Gynecology | Admitting: Obstetrics & Gynecology

## 2015-04-27 ENCOUNTER — Encounter (HOSPITAL_COMMUNITY): Payer: Self-pay | Admitting: *Deleted

## 2015-04-27 DIAGNOSIS — R102 Pelvic and perineal pain: Secondary | ICD-10-CM | POA: Insufficient documentation

## 2015-04-27 DIAGNOSIS — Z3A01 Less than 8 weeks gestation of pregnancy: Secondary | ICD-10-CM

## 2015-04-27 DIAGNOSIS — Z885 Allergy status to narcotic agent status: Secondary | ICD-10-CM | POA: Insufficient documentation

## 2015-04-27 DIAGNOSIS — K589 Irritable bowel syndrome without diarrhea: Secondary | ICD-10-CM | POA: Diagnosis not present

## 2015-04-27 DIAGNOSIS — O219 Vomiting of pregnancy, unspecified: Secondary | ICD-10-CM | POA: Insufficient documentation

## 2015-04-27 DIAGNOSIS — Z87891 Personal history of nicotine dependence: Secondary | ICD-10-CM | POA: Insufficient documentation

## 2015-04-27 HISTORY — DX: Irritable bowel syndrome without diarrhea: K58.9

## 2015-04-27 LAB — URINE MICROSCOPIC-ADD ON: RBC / HPF: NONE SEEN RBC/hpf (ref 0–5)

## 2015-04-27 LAB — URINALYSIS, ROUTINE W REFLEX MICROSCOPIC
Bilirubin Urine: NEGATIVE
Glucose, UA: NEGATIVE mg/dL
Hgb urine dipstick: NEGATIVE
KETONES UR: 15 mg/dL — AB
NITRITE: NEGATIVE
PROTEIN: 30 mg/dL — AB
Specific Gravity, Urine: 1.015 (ref 1.005–1.030)
pH: 7 (ref 5.0–8.0)

## 2015-04-27 MED ORDER — LACTATED RINGERS IV BOLUS (SEPSIS)
1000.0000 mL | Freq: Once | INTRAVENOUS | Status: AC
  Administered 2015-04-27: 1000 mL via INTRAVENOUS

## 2015-04-27 MED ORDER — METOCLOPRAMIDE HCL 5 MG/ML IJ SOLN
10.0000 mg | Freq: Once | INTRAMUSCULAR | Status: AC
  Administered 2015-04-27: 10 mg via INTRAVENOUS
  Filled 2015-04-27: qty 2

## 2015-04-27 MED ORDER — PROMETHAZINE HCL 25 MG PO TABS: 12.5000 mg | ORAL_TABLET | Freq: Four times a day (QID) | ORAL | Status: DC | PRN

## 2015-04-27 MED ORDER — METOCLOPRAMIDE HCL 10 MG PO TABS: 10.0000 mg | ORAL_TABLET | Freq: Four times a day (QID) | ORAL | Status: DC

## 2015-04-27 MED ORDER — PROMETHAZINE HCL 25 MG/ML IJ SOLN
25.0000 mg | Freq: Once | INTRAMUSCULAR | Status: AC
  Administered 2015-04-27: 25 mg via INTRAVENOUS
  Filled 2015-04-27: qty 1

## 2015-04-27 NOTE — MAU Provider Note (Signed)
Chief Complaint: Emesis During Pregnancy   None     SUBJECTIVE HPI: Lori Payne is a 24 y.o. G2P1001 at [redacted]w[redacted]d by LMP who presents to maternity admissions reporting nausea with daily vomiting. She reports she has not kept down any food or fluids x 3 days.  She was 2 weeks ago in ED with abdominal pain in pregnancy and had [place] which showed GS but no YS.  Plan from ED was for follow up ultrasound.  Pt called [facility] and ultrasound scheduled 05/03/14.  She denies abdominal pain or bleeding today.   She denies vaginal itching/burning, urinary symptoms, h/a, dizziness, n/v, or fever/chills.     HPI  Past Medical History  Diagnosis Date  . IBS (irritable bowel syndrome)    Past Surgical History  Procedure Laterality Date  . No past surgeries     Social History   Social History  . Marital Status: Single    Spouse Name: N/A  . Number of Children: N/A  . Years of Education: N/A   Occupational History  . Not on file.   Social History Main Topics  . Smoking status: Former Smoker    Types: Cigarettes  . Smokeless tobacco: Not on file  . Alcohol Use: No     Comment: social  . Drug Use: No  . Sexual Activity: Yes   Other Topics Concern  . Not on file   Social History Narrative   No current facility-administered medications on file prior to encounter.   Current Outpatient Prescriptions on File Prior to Encounter  Medication Sig Dispense Refill  . Prenatal Vit-Fe Fumarate-FA (MULTIVITAMIN-PRENATAL) 27-0.8 MG TABS tablet Take 1 tablet by mouth daily at 12 noon. (Patient not taking: Reported on 04/27/2015) 30 each 0   Allergies  Allergen Reactions  . Hydrocodone Nausea Only    nausea  . Dilaudid [Hydromorphone Hcl] Itching    ROS:  Review of Systems  Constitutional: Negative for fever, chills and fatigue.  Respiratory: Negative for shortness of breath.   Cardiovascular: Negative for chest pain.  Gastrointestinal: Negative for abdominal pain.  Genitourinary: Negative for  dysuria, flank pain, vaginal bleeding, vaginal discharge, difficulty urinating, vaginal pain and pelvic pain.  Neurological: Negative for dizziness and headaches.  Psychiatric/Behavioral: Negative.      I have reviewed patient's Past Medical Hx, Surgical Hx, Family Hx, Social Hx, medications and allergies.   Physical Exam   Patient Vitals for the past 24 hrs:  BP Temp Temp src Pulse Resp  04/27/15 0944 (!) 107/52 mmHg 98 F (36.7 C) Oral 64 18   Constitutional: Well-developed, well-nourished female in no acute distress.  Cardiovascular: normal rate Respiratory: normal effort GI: Abd soft, non-tender. Pos BS x 4 MS: Extremities nontender, no edema, normal ROM Neurologic: Alert and oriented x 4.  GU: Neg CVAT.    LAB RESULTS Results for orders placed or performed during the hospital encounter of 04/27/15 (from the past 24 hour(s))  Urinalysis, Routine w reflex microscopic (not at [facility])     Status: Abnormal   Collection Time: 04/27/15  9:47 AM  Result Value Ref Range   Color, Urine YELLOW YELLOW   APPearance CLEAR CLEAR   Specific Gravity, Urine 1.015 1.005 - 1.030   pH 7.0 5.0 - 8.0   Glucose, UA NEGATIVE NEGATIVE mg/dL   Hgb urine dipstick NEGATIVE NEGATIVE   Bilirubin Urine NEGATIVE NEGATIVE   Ketones, ur 15 (A) NEGATIVE mg/dL   Protein, ur 30 (A) NEGATIVE mg/dL   Nitrite NEGATIVE NEGATIVE  Leukocytes, UA TRACE (A) NEGATIVE  Urine microscopic-add on     Status: Abnormal   Collection Time: 04/27/15  9:47 AM  Result Value Ref Range   Squamous Epithelial / LPF 6-30 (A) NONE SEEN   WBC, UA 0-5 0 - 5 WBC/hpf   RBC / HPF NONE SEEN 0 - 5 RBC/hpf   Bacteria, UA FEW (A) NONE SEEN   Urine-Other MUCOUS PRESENT     --/--/O POS (12/19 1830)  IMAGING [place] Ob Comp Less 14 Wks  04/17/2015  CLINICAL DATA:  Pelvic pain 1 month. Quantitative beta HCG 4,924. Estimated gestational age [redacted] weeks 3 days per LMP. EXAM: OBSTETRIC <14 WK US AND TRANSVAGINAL OB US TECHNIQUE: Both  transabdominal and transvaginal ultrasound examinations were performed for complete evaluation of the gestation as well as the maternal uterus, adnexal regions, and pelvic cul-de-sac. Transvaginal technique was performed to assess early pregnancy. COMPARISON:  Ultrasound 02/24/2015 FINDINGS: Intrauterine gestational sac: Visualized/normal in shape. Yolk sac:  Not visualized. Embryo:  Not visualized. Cardiac Activity: Not visualized. Heart Rate: Not visualized.  Bpm MSD: 6.6  mm   5 w   3  d US EDC: 12/15/2015 Subchorionic hemorrhage: Small to moderate size subchorionic hemorrhage. Maternal uterus/adnexae: 1.2 cm corpus luteum right ovary. Ovaries are otherwise unremarkable. Trace free pelvic fluid. IMPRESSION: Intrauterine gestational sac with mean sac diameter compatible with estimated gestational age [redacted] weeks 3 days. No yolk sac or embryo visualized. Findings likely represent a normal early pregnancy. Recommend serial quantitative beta HCG and follow-up ultrasound 10-14 days. Small to moderate subchorionic hemorrhage. Electronically Signed   By: Elberta Fortisaniel  Boyle M.D.   On: 04/17/2015 20:51   [place] Ob Transvaginal  04/17/2015  CLINICAL DATA:  Pelvic pain 1 month. Quantitative beta HCG 4,924. Estimated gestational age [redacted] weeks 3 days per LMP. EXAM: OBSTETRIC <14 WK US AND TRANSVAGINAL OB US TECHNIQUE: Both transabdominal and transvaginal ultrasound examinations were performed for complete evaluation of the gestation as well as the maternal uterus, adnexal regions, and pelvic cul-de-sac. Transvaginal technique was performed to assess early pregnancy. COMPARISON:  Ultrasound 02/24/2015 FINDINGS: Intrauterine gestational sac: Visualized/normal in shape. Yolk sac:  Not visualized. Embryo:  Not visualized. Cardiac Activity: Not visualized. Heart Rate: Not visualized.  Bpm MSD: 6.6  mm   5 w   3  d US EDC: 12/15/2015 Subchorionic hemorrhage: Small to moderate size subchorionic hemorrhage. Maternal uterus/adnexae: 1.2 cm  corpus luteum right ovary. Ovaries are otherwise unremarkable. Trace free pelvic fluid. IMPRESSION: Intrauterine gestational sac with mean sac diameter compatible with estimated gestational age [redacted] weeks 3 days. No yolk sac or embryo visualized. Findings likely represent a normal early pregnancy. Recommend serial quantitative beta HCG and follow-up ultrasound 10-14 days. Small to moderate subchorionic hemorrhage. Electronically Signed   By: Elberta Fortisaniel  Boyle M.D.   On: 04/17/2015 20:51    MAU Management/MDM: Ordered labs and reviewed results.  Treatments in MAU included LR 1000 ml, Phenergan 25 mg IV and Reglan 10 mg IV with significant improvements in pt nausea.  No vomiting while in MAU and pt tolerated PO fluids well. Pt stable at time of discharge.  ASSESSMENT 1. Nausea and vomiting during pregnancy prior to [redacted] weeks gestation     PLAN Discharge home Reglan 10 mg PO Phenergan 21.5-25 mg PO Q 6 hours PRN Keep scheduled US on 1/5 Return to MAU with any warning signs of ectopic pregnancy (abdominal pain or vaginal bleeding)  Follow-up Information    Please follow up.   Why:  Start prenatal care as soon as possible, Return to MAU as needed for emergencies      Sharen Counter Certified Nurse-Midwife 04/27/2015  1:23 PM

## 2015-04-27 NOTE — Discharge Instructions (Signed)

## 2015-04-27 NOTE — MAU Note (Signed)
Vomiting since Sunday night, unable to eat or drink anything.  Also dizzy, has mid-upper abd pain with vomiting.  Denies bleeding, no diarrhea.

## 2015-04-30 NOTE — L&D Delivery Note (Signed)
Delivery Note At  a viable female was delivered via  (Presentation:direct OA ;  ).  APGAR: 9 and 9 , Placenta status: intact with 3 vessel cord   Anesthesia:  epidural Episiotomy:   none Lacerations:  none Est. Blood Loss (mL):  200  Mom to postpartum.  Baby to Couplet care / Skin to Skin.  Ernestina Pennaicholas Schenk 12/15/2015, 9:20 PM

## 2015-05-03 ENCOUNTER — Ambulatory Visit (HOSPITAL_COMMUNITY)
Admission: RE | Admit: 2015-05-03 | Discharge: 2015-05-03 | Disposition: A | Discharge status: Home / Self Care | Payer: Managed Care, Other (non HMO) | Source: Ambulatory Visit | Attending: Family Medicine | Admitting: Family Medicine

## 2015-05-03 ENCOUNTER — Ambulatory Visit (INDEPENDENT_AMBULATORY_CARE_PROVIDER_SITE_OTHER): Payer: Managed Care, Other (non HMO) | Admitting: Certified Nurse Midwife

## 2015-05-03 DIAGNOSIS — O3680X0 Pregnancy with inconclusive fetal viability, not applicable or unspecified: Secondary | ICD-10-CM | POA: Diagnosis not present

## 2015-05-03 DIAGNOSIS — Z36 Encounter for antenatal screening of mother: Secondary | ICD-10-CM | POA: Insufficient documentation

## 2015-05-03 DIAGNOSIS — O3680X1 Pregnancy with inconclusive fetal viability, fetus 1: Secondary | ICD-10-CM

## 2015-05-03 DIAGNOSIS — Z3490 Encounter for supervision of normal pregnancy, unspecified, unspecified trimester: Secondary | ICD-10-CM

## 2015-05-03 NOTE — Patient Instructions (Signed)

## 2015-05-03 NOTE — Progress Notes (Signed)
Ms. Lori Payne  is a 25 y.o. G2P1001 at 2127w2d who [facility] today for results of viability u/s.and US showed positive FHT's . She denies/endorses no pain, vaginal bleeding or fever today.   OB History  Gravida Para Term Preterm AB SAB TAB Ectopic Multiple Living  2 1 1  0 0 0 0 0 0 1    # Outcome Date GA Lbr Len/2nd Weight Sex Delivery Anes PTL Lv  2 Current           1 Term               Past Medical History  Diagnosis Date  . IBS (irritable bowel syndrome)      LMP 03/06/2015  CONSTITUTIONAL: Well-developed, well-nourished female in no acute distress.  ENT: External right and left ear normal.  EYES: EOM intact, conjunctivae normal.  MUSCULOSKELETAL: Normal range of motion.  CARDIOVASCULAR: Regular heart rate RESPIRATORY: Normal effort NEUROLOGICAL: Alert and oriented to person, place, and time.  SKIN: Skin is warm and dry. No rash noted. Not diaphoretic. No erythema. No pallor. PSYCH: Normal mood and affect. Normal behavior. Normal judgment and thought content.  No results found for this or any previous visit (from the past 24 hour(s)). [place] Ob Transvaginal  05/03/2015  CLINICAL DATA:  Followup viability. EXAM: OBSTETRIC <14 WK US AND TRANSVAGINAL OB US TECHNIQUE: Both transabdominal and transvaginal ultrasound examinations were performed for complete evaluation of the gestation as well as the maternal uterus, adnexal regions, and pelvic cul-de-sac. Transvaginal technique was performed to assess early pregnancy. COMPARISON:  04/17/2015 FINDINGS: Intrauterine gestational sac: Single Yolk sac:  Yes Embryo:  Yes Cardiac Activity: Yes Heart Rate: 133  bpm CRL:  11.9  mm   7 w   3 d                  US EDC: 12/17/2015 Subchorionic hemorrhage:  None visualized. Maternal uterus/adnexae: Subchorionic hemorrhage: None. Right ovary: Normal Left ovary: Normal Other :None Free fluid:  None IMPRESSION: 1. Single living intrauterine gestation is identified. The  estimated gestational age is 7 weeks and 3 days. The gestational age by last menstrual period is 8 weeks and 2 days. 2. No complications. Electronically Signed   By: Signa Kellaylor  Stroud M.D.   On: 05/03/2015 11:40   A: Viable IUP @ 7w3 by U/S  P: Schedule initial prenatal visit.  Rhea PinkLori A Clemmons, CNM 05/03/2015 11:50 AM

## 2015-05-23 ENCOUNTER — Ambulatory Visit (INDEPENDENT_AMBULATORY_CARE_PROVIDER_SITE_OTHER): Payer: Managed Care, Other (non HMO) | Admitting: Family

## 2015-05-23 ENCOUNTER — Encounter: Payer: Self-pay | Admitting: Family

## 2015-05-23 VITALS — BP 107/48 | HR 59 | Temp 98.0°F | Wt 159.7 lb

## 2015-05-23 DIAGNOSIS — Z3490 Encounter for supervision of normal pregnancy, unspecified, unspecified trimester: Secondary | ICD-10-CM | POA: Insufficient documentation

## 2015-05-23 DIAGNOSIS — Z3491 Encounter for supervision of normal pregnancy, unspecified, first trimester: Secondary | ICD-10-CM

## 2015-05-23 DIAGNOSIS — Z3481 Encounter for supervision of other normal pregnancy, first trimester: Secondary | ICD-10-CM

## 2015-05-23 DIAGNOSIS — Z124 Encounter for screening for malignant neoplasm of cervix: Secondary | ICD-10-CM

## 2015-05-23 LAB — POCT URINALYSIS DIP (DEVICE)
Bilirubin Urine: NEGATIVE
GLUCOSE, UA: NEGATIVE mg/dL
Hgb urine dipstick: NEGATIVE
Nitrite: NEGATIVE
PH: 7 (ref 5.0–8.0)
PROTEIN: NEGATIVE mg/dL
SPECIFIC GRAVITY, URINE: 1.025 (ref 1.005–1.030)
UROBILINOGEN UA: 0.2 mg/dL (ref 0.0–1.0)

## 2015-05-23 MED ORDER — METOCLOPRAMIDE HCL 10 MG PO TABS: 10.0000 mg | ORAL_TABLET | Freq: Three times a day (TID) | ORAL | Status: DC

## 2015-05-23 NOTE — Patient Instructions (Addendum)
First Trimester of Pregnancy The first trimester of pregnancy is from week 1 until the end of week 12 (months 1 through 3). A week after a sperm fertilizes an egg, the egg will implant on the wall of the uterus. This embryo will begin to develop into a baby. Genes from you and your partner are forming the baby. The female genes determine whether the baby is a boy or a girl. At 6-8 weeks, the eyes and face are formed, and the heartbeat can be seen on ultrasound. At the end of 12 weeks, all the baby's organs are formed.  Now that you are pregnant, you will want to do everything you can to have a healthy baby. Two of the most important things are to get good prenatal care and to follow your health care provider's instructions. Prenatal care is all the medical care you receive before the baby's birth. This care will help prevent, find, and treat any problems during the pregnancy and childbirth. BODY CHANGES Your body goes through many changes during pregnancy. The changes vary from woman to woman.   You may gain or lose a couple of pounds at first.  You may feel sick to your stomach (nauseous) and throw up (vomit). If the vomiting is uncontrollable, call your health care provider.  You may tire easily.  You may develop headaches that can be relieved by medicines approved by your health care provider.  You may urinate more often. Painful urination may mean you have a bladder infection.  You may develop heartburn as a result of your pregnancy.  You may develop constipation because certain hormones are causing the muscles that push waste through your intestines to slow down.  You may develop hemorrhoids or swollen, bulging veins (varicose veins).  Your breasts may begin to grow larger and become tender. Your nipples may stick out more, and the tissue that surrounds them (areola) may become darker.  Your gums may bleed and may be sensitive to brushing and flossing.  Dark spots or blotches  (chloasma, mask of pregnancy) may develop on your face. This will likely fade after the baby is born.  Your menstrual periods will stop.  You may have a loss of appetite.  You may develop cravings for certain kinds of food.  You may have changes in your emotions from day to day, such as being excited to be pregnant or being concerned that something may go wrong with the pregnancy and baby.  You may have more vivid and strange dreams.  You may have changes in your hair. These can include thickening of your hair, rapid growth, and changes in texture. Some women also have hair loss during or after pregnancy, or hair that feels dry or thin. Your hair will most likely return to normal after your baby is born. WHAT TO EXPECT AT YOUR PRENATAL VISITS During a routine prenatal visit:  You will be weighed to make sure you and the baby are growing normally.  Your blood pressure will be taken.  Your abdomen will be measured to track your baby's growth.  The fetal heartbeat will be listened to starting around week 10 or 12 of your pregnancy.  Test results from any previous visits will be discussed. Your health care provider may ask you:  How you are feeling.  If you are feeling the baby move.  If you have had any abnormal symptoms, such as leaking fluid, bleeding, severe headaches, or abdominal cramping.  If you are using any tobacco products,   including cigarettes, chewing tobacco, and electronic cigarettes.  If you have any questions. Other tests that may be performed during your first trimester include:  Blood tests to find your blood type and to check for the presence of any previous infections. They will also be used to check for low iron levels (anemia) and Rh antibodies. Later in the pregnancy, blood tests for diabetes will be done along with other tests if problems develop.  Urine tests to check for infections, diabetes, or protein in the urine.  An ultrasound to confirm the  proper growth and development of the baby.  An amniocentesis to check for possible genetic problems.  Fetal screens for spina bifida and Down syndrome.  You may need other tests to make sure you and the baby are doing well.  HIV (human immunodeficiency virus) testing. Routine prenatal testing includes screening for HIV, unless you choose not to have this test. HOME CARE INSTRUCTIONS  Medicines  Follow your health care provider's instructions regarding medicine use. Specific medicines may be either safe or unsafe to take during pregnancy.  Take your prenatal vitamins as directed.  If you develop constipation, try taking a stool softener if your health care provider approves. Diet  Eat regular, well-balanced meals. Choose a variety of foods, such as meat or vegetable-based protein, fish, milk and low-fat dairy products, vegetables, fruits, and whole grain breads and cereals. Your health care provider will help you determine the amount of weight gain that is right for you.  Avoid raw meat and uncooked cheese. These carry germs that can cause birth defects in the baby.  Eating four or five small meals rather than three large meals a day may help relieve nausea and vomiting. If you start to feel nauseous, eating a few soda crackers can be helpful. Drinking liquids between meals instead of during meals also seems to help nausea and vomiting.  If you develop constipation, eat more high-fiber foods, such as fresh vegetables or fruit and whole grains. Drink enough fluids to keep your urine clear or pale yellow. Activity and Exercise  Exercise only as directed by your health care provider. Exercising will help you:  Control your weight.  Stay in shape.  Be prepared for labor and delivery.  Experiencing pain or cramping in the lower abdomen or low back is a good sign that you should stop exercising. Check with your health care provider before continuing normal exercises.  Try to avoid  standing for long periods of time. Move your legs often if you must stand in one place for a long time.  Avoid heavy lifting.  Wear low-heeled shoes, and practice good posture.  You may continue to have sex unless your health care provider directs you otherwise. Relief of Pain or Discomfort  Wear a good support bra for breast tenderness.   Take warm sitz baths to soothe any pain or discomfort caused by hemorrhoids. Use hemorrhoid cream if your health care provider approves.   Rest with your legs elevated if you have leg cramps or low back pain.  If you develop varicose veins in your legs, wear support hose. Elevate your feet for 15 minutes, 3-4 times a day. Limit salt in your diet. Prenatal Care  Schedule your prenatal visits by the twelfth week of pregnancy. They are usually scheduled monthly at first, then more often in the last 2 months before delivery.  Write down your questions. Take them to your prenatal visits.  Keep all your prenatal visits as directed by your   health care provider. Safety  Wear your seat belt at all times when driving.  Make a list of emergency phone numbers, including numbers for family, friends, the hospital, and police and fire departments. General Tips  Ask your health care provider for a referral to a local prenatal education class. Begin classes no later than at the beginning of month 6 of your pregnancy.  Ask for help if you have counseling or nutritional needs during pregnancy. Your health care provider can offer advice or refer you to specialists for help with various needs.  Do not use hot tubs, steam rooms, or saunas.  Do not douche or use tampons or scented sanitary pads.  Do not cross your legs for long periods of time.  Avoid cat litter boxes and soil used by cats. These carry germs that can cause birth defects in the baby and possibly loss of the fetus by miscarriage or stillbirth.  Avoid all smoking, herbs, alcohol, and medicines  not prescribed by your health care provider. Chemicals in these affect the formation and growth of the baby.  Do not use any tobacco products, including cigarettes, chewing tobacco, and electronic cigarettes. If you need help quitting, ask your health care provider. You may receive counseling support and other resources to help you quit.  Schedule a dentist appointment. At home, brush your teeth with a soft toothbrush and be gentle when you floss. SEEK MEDICAL CARE IF:   You have dizziness.  You have mild pelvic cramps, pelvic pressure, or nagging pain in the abdominal area.  You have persistent nausea, vomiting, or diarrhea.  You have a bad smelling vaginal discharge.  You have pain with urination.  You notice increased swelling in your face, hands, legs, or ankles. SEEK IMMEDIATE MEDICAL CARE IF:   You have a fever.  You are leaking fluid from your vagina.  You have spotting or bleeding from your vagina.  You have severe abdominal cramping or pain.  You have rapid weight gain or loss.  You vomit blood or material that looks like coffee grounds.  You are exposed to [place] measles and have never had them.  You are exposed to fifth disease or chickenpox.  You develop a severe headache.  You have shortness of breath.  You have any kind of trauma, such as from a fall or a car accident.   This information is not intended to replace advice given to you by your health care provider. Make sure you discuss any questions you have with your health care provider.   Document Released: 04/09/2001 Document Revised: 05/06/2014 Document Reviewed: 02/23/2013 Elsevier Interactive Patient Education [employer].  Breastfeeding Deciding to breastfeed is one of the best choices you can make for you and your baby. A change in hormones during pregnancy causes your breast tissue to grow and increases the number and size of your milk ducts. These hormones also allow proteins, sugars, and  fats from your blood supply to make breast milk in your milk-producing glands. Hormones prevent breast milk from being released before your baby is born as well as prompt milk flow after birth. Once breastfeeding has begun, thoughts of your baby, as well as his or her sucking or crying, can stimulate the release of milk from your milk-producing glands.  BENEFITS OF BREASTFEEDING For Your Baby  Your first milk (colostrum) helps your baby's digestive system function better.  There are antibodies in your milk that help your baby fight off infections.  Your baby has a lower  a lower incidence of asthma, allergies, and sudden infant death syndrome.  The nutrients in breast milk are better for your baby than infant formulas and are designed uniquely for your baby's needs.  Breast milk improves your baby's brain development.  Your baby is less likely to develop other conditions, such as childhood obesity, asthma, or type 2 diabetes mellitus. For You  Breastfeeding helps to create a very special bond between you and your baby.  Breastfeeding is convenient. Breast milk is always available at the correct temperature and costs nothing.  Breastfeeding helps to burn calories and helps you lose the weight gained during pregnancy.  Breastfeeding makes your uterus contract to its prepregnancy size faster and slows bleeding (lochia) after you give birth.   Breastfeeding helps to lower your risk of developing type 2 diabetes mellitus, osteoporosis, and breast or ovarian cancer later in life. SIGNS THAT YOUR BABY IS HUNGRY Early Signs of Hunger  Increased alertness or activity.  Stretching.  Movement of the head from side to side.  Movement of the head and opening of the mouth when the corner of the mouth or cheek is stroked (rooting).  Increased sucking sounds, smacking lips, cooing, sighing, or squeaking.  Hand-to-mouth movements.  Increased sucking of fingers or hands. Late Signs of  Hunger  Fussing.  Intermittent crying. Extreme Signs of Hunger Signs of extreme hunger will require calming and consoling before your baby will be able to breastfeed successfully. Do not wait for the following signs of extreme hunger to occur before you initiate breastfeeding:  Restlessness.  A loud, strong cry.  Screaming. BREASTFEEDING BASICS Breastfeeding Initiation  Find a comfortable place to sit or lie down, with your neck and back well supported.  Place a pillow or rolled up blanket under your baby to bring him or her to the level of your breast (if you are seated). Nursing pillows are specially designed to help support your arms and your baby while you breastfeed.  Make sure that your baby's abdomen is facing your abdomen.  Gently massage your breast. With your fingertips, massage from your chest wall toward your nipple in a circular motion. This encourages milk flow. You may need to continue this action during the feeding if your milk flows slowly.  Support your breast with 4 fingers underneath and your thumb above your nipple. Make sure your fingers are well away from your nipple and your baby's mouth.  Stroke your baby's lips gently with your finger or nipple.  When your baby's mouth is open wide enough, quickly bring your baby to your breast, placing your entire nipple and as much of the colored area around your nipple (areola) as possible into your baby's mouth.  More areola should be visible above your baby's upper lip than below the lower lip.  Your baby's tongue should be between his or her lower gum and your breast.  Ensure that your baby's mouth is correctly positioned around your nipple (latched). Your baby's lips should create a seal on your breast and be turned out (everted).  It is common for your baby to suck about 2-3 minutes in order to start the flow of breast milk. Latching Teaching your baby how to latch on to your breast properly is very important.  An improper latch can cause nipple pain and decreased milk supply for you and poor weight gain in your baby. Also, if your baby is not latched onto your nipple properly, he or she may swallow some air during feeding. This   can make your baby fussy. Burping your baby when you switch breasts during the feeding can help to get rid of the air. However, teaching your baby to latch on properly is still the best way to prevent fussiness from swallowing air while breastfeeding. Signs that your baby has successfully latched on to your nipple:  Silent tugging or silent sucking, without causing you pain.  Swallowing heard between every 3-4 sucks.  Muscle movement above and in front of his or her ears while sucking. Signs that your baby has not successfully latched on to nipple:  Sucking sounds or smacking sounds from your baby while breastfeeding.  Nipple pain. If you think your baby has not latched on correctly, slip your finger into the corner of your baby's mouth to break the suction and place it between your baby's gums. Attempt breastfeeding initiation again. Signs of Successful Breastfeeding Signs from your baby:  A gradual decrease in the number of sucks or complete cessation of sucking.  Falling asleep.  Relaxation of his or her body.  Retention of a small amount of milk in his or her mouth.  Letting go of your breast by himself or herself. Signs from you:  Breasts that have increased in firmness, weight, and size 1-3 hours after feeding.  Breasts that are softer immediately after breastfeeding.  Increased milk volume, as well as a change in milk consistency and color by the fifth day of breastfeeding.  Nipples that are not sore, cracked, or bleeding. Signs That Your Baby is Getting Enough Milk  Wetting at least 3 diapers in a 24-hour period. The urine should be clear and pale yellow by age 5 days.  At least 3 stools in a 24-hour period by age 5 days. The stool should be soft and  yellow.  At least 3 stools in a 24-hour period by age 7 days. The stool should be seedy and yellow.  No loss of weight greater than 10% of birth weight during the first 3 days of age.  Average weight gain of 4-7 ounces (113-198 g) per week after age 4 days.  Consistent daily weight gain by age 5 days, without weight loss after the age of 2 weeks. After a feeding, your baby may spit up a small amount. This is common. BREASTFEEDING FREQUENCY AND DURATION Frequent feeding will help you make more milk and can prevent sore nipples and breast engorgement. Breastfeed when you feel the need to reduce the fullness of your breasts or when your baby shows signs of hunger. This is called "breastfeeding on demand." Avoid introducing a pacifier to your baby while you are working to establish breastfeeding (the first 4-6 weeks after your baby is born). After this time you may choose to use a pacifier. Research has shown that pacifier use during the first year of a baby's life decreases the risk of sudden infant death syndrome (SIDS). Allow your baby to feed on each breast as long as he or she wants. Breastfeed until your baby is finished feeding. When your baby unlatches or falls asleep while feeding from the first breast, offer the second breast. Because newborns are often sleepy in the first few weeks of life, you may need to awaken your baby to get him or her to feed. Breastfeeding times will vary from baby to baby. However, the following rules can serve as a guide to help you ensure that your baby is properly fed:  Newborns (babies 4 weeks of age or younger) may breastfeed every 1-3 hours.    Newborns should not go longer than 3 hours during the day or 5 hours during the night without breastfeeding.  You should breastfeed your baby a minimum of 8 times in a 24-hour period until you begin to introduce solid foods to your baby at around 6 months of age. BREAST MILK PUMPING Pumping and storing breast milk  allows you to ensure that your baby is exclusively fed your breast milk, even at times when you are unable to breastfeed. This is especially important if you are going back to work while you are still breastfeeding or when you are not able to be present during feedings. Your lactation consultant can give you guidelines on how long it is safe to store breast milk. A breast pump is a machine that allows you to pump milk from your breast into a sterile bottle. The pumped breast milk can then be stored in a refrigerator or freezer. Some breast pumps are operated by hand, while others use electricity. Ask your lactation consultant which type will work best for you. Breast pumps can be purchased, but some hospitals and breastfeeding support groups lease breast pumps on a monthly basis. A lactation consultant can teach you how to hand express breast milk, if you prefer not to use a pump. CARING FOR YOUR BREASTS WHILE YOU BREASTFEED Nipples can become dry, cracked, and sore while breastfeeding. The following recommendations can help keep your breasts moisturized and healthy:  Avoid using soap on your nipples.  Wear a supportive bra. Although not required, special nursing bras and tank tops are designed to allow access to your breasts for breastfeeding without taking off your entire bra or top. Avoid wearing underwire-style bras or extremely tight bras.  Air dry your nipples for 3-4minutes after each feeding.  Use only cotton bra pads to absorb leaked breast milk. Leaking of breast milk between feedings is normal.  Use lanolin on your nipples after breastfeeding. Lanolin helps to maintain your skin's normal moisture barrier. If you use pure lanolin, you do not need to wash it off before feeding your baby again. Pure lanolin is not toxic to your baby. You may also hand express a few drops of breast milk and gently massage that milk into your nipples and allow the milk to air dry. In the first few weeks after  giving birth, some women experience extremely full breasts (engorgement). Engorgement can make your breasts feel heavy, warm, and tender to the touch. Engorgement peaks within 3-5 days after you give birth. The following recommendations can help ease engorgement:  Completely empty your breasts while breastfeeding or pumping. You may want to start by applying warm, moist heat (in the shower or with warm water-soaked hand towels) just before feeding or pumping. This increases circulation and helps the milk flow. If your baby does not completely empty your breasts while breastfeeding, pump any extra milk after he or she is finished.  Wear a snug bra (nursing or regular) or tank top for 1-2 days to signal your body to slightly decrease milk production.  Apply ice packs to your breasts, unless this is too uncomfortable for you.  Make sure that your baby is latched on and positioned properly while breastfeeding. If engorgement persists after 48 hours of following these recommendations, contact your health care provider or a lactation consultant. OVERALL HEALTH CARE RECOMMENDATIONS WHILE BREASTFEEDING  Eat healthy foods. Alternate between meals and snacks, eating 3 of each per day. Because what you eat affects your breast milk, some of the foods   make your baby more irritable than usual. Avoid eating these foods if you are sure that they are negatively affecting your baby.  Drink milk, fruit juice, and water to satisfy your thirst (about 10 glasses a day).  Rest often, relax, and continue to take your prenatal vitamins to prevent fatigue, stress, and anemia.  Continue breast self-awareness checks.  Avoid chewing and smoking tobacco. Chemicals from cigarettes that pass into breast milk and exposure to secondhand smoke may harm your baby.  Avoid alcohol and drug use, including marijuana. Some medicines that may be harmful to your baby can pass through breast milk. It is important to ask your health  care provider before taking any medicine, including all over-the-counter and prescription medicine as well as vitamin and herbal supplements. It is possible to become pregnant while breastfeeding. If birth control is desired, ask your health care provider about options that will be safe for your baby. SEEK MEDICAL CARE IF:  You feel like you want to stop breastfeeding or have become frustrated with breastfeeding.  You have painful breasts or nipples.  Your nipples are cracked or bleeding.  Your breasts are red, tender, or warm.  You have a swollen area on either breast.  You have a fever or chills.  You have nausea or vomiting.  You have drainage other than breast milk from your nipples.  Your breasts do not become full before feedings by the fifth day after you give birth.  You feel sad and depressed.  Your baby is too sleepy to eat well.  Your baby is having trouble sleeping.   Your baby is wetting less than 3 diapers in a 24-hour period.  Your baby has less than 3 stools in a 24-hour period.  Your baby's skin or the white part of his or her eyes becomes yellow.   Your baby is not gaining weight by 40 days of age. SEEK IMMEDIATE MEDICAL CARE IF:  Your baby is overly tired (lethargic) and does not want to wake up and feed.  Your baby develops an unexplained fever.   This information is not intended to replace advice given to you by your health care provider. Make sure you discuss any questions you have with your health care provider.   Document Released: 04/15/2005 Document Revised: 01/04/2015 Document Reviewed: 10/07/2012 Elsevier Interactive Patient Education 2016 [employer].   AREA PEDIATRIC/FAMILY PRACTICE PHYSICIANS  [facility] [address] [place], [place] [address] Phone - [phone]   Fax - [phone]  [facility] [address] [place], [place]  [address] Phone - [phone]   Fax - [phone]  [facility]  [facility] [address] [place], [place]  [address] Phone - [phone]   Fax - [phone]  [facility] [address] [place], [place]  [address] Phone - [phone]   Fax - [phone]  [facility] [address] [place], [place]  [address] Phone - [phone]   Fax - [phone]  [facility] [address], [address] [place], [place]  [address] Phone - [phone]   Fax - [phone]  [facility] [place] [address], [address] [place], [place]  [address] Phone - [phone]   Fax - [phone]  [facility] AT [facility] [address] [place], [address] [place], [place]  [address] Phone - [phone]   Fax - [phone]  [facility] AT [facility] [address] [place], [place]  [address]  Phone - [phone]   Fax - [phone] [facility] [address] [place], [place]  [address] Phone - [phone]   Fax - [phone]  [facility] [address] [place], [place]  [address] Phone - [phone]   Fax - [phone]  [facility] [address], [address] [place], [place]  [address] Phone - [phone]   Fax - [phone]  [facility] [address] [place], [place]  [address] Phone - [phone]   Fax - [phone]  [facility] [address] [place], [place]  [address] Phone - [phone]  [facility] [address] [place], [place]  [address] Phone - [phone]   Fax - [phone]  [facility] [address] [place], [place]  [address] Phone - [phone]   Fax - [phone]  [facility] [address] [place], [place]  [address] Phone - [phone]   Fax - [phone]  [facility] [address] [place], [place]  [address] Phone -  [phone]   Fax - [phone]   [facility] [address] [place], [place]  [address] Phone - [phone]   Fax - [phone]  [facility] [address] [place], [place]  [address] Phone - [phone]   Fax - [phone]  DAVID RUBIN [address] [place], [place]  [address] Phone - [phone]   Fax - [phone]  [facility] [address] [place], [place]  [address] Phone - [phone]   Fax - [phone]  [facility] Alita Chyle [address] [place], [place]  [address] Phone - [phone]   Fax - [phone] Gerarda Fraction [address] [place], [place]  [address] Phone - [phone]   Fax - [phone]  [facility] [address] [place], [place]  [address] Phone - [phone]   Fax - [phone]  [facility] - [place] [address] [place], [place]  [address] Phone - [phone]   Fax - [phone]

## 2015-05-23 NOTE — Progress Notes (Signed)
Here for Initial Prenatal visit. States had IUd taken out 03/08/15.Given new pregnancy education packet. States legs are restless.  Declines flu shot.

## 2015-05-23 NOTE — Progress Notes (Signed)
   Subjective:    Lori Payne is a G2P1002 [redacted]w[redacted]d being seen today for her first obstetrical visit.  Her obstetrical history is significant for prior  smoker. Patient does intend to breast feed. Pregnancy history fully reviewed.  Patient reports no complaints.  Filed Vitals:   05/23/15 1312  BP: 107/48  Pulse: 59  Temp: 98 F (36.7 C)  Weight: 159 lb 11.2 oz (72.439 kg)    HISTORY: OB History  Gravida Para Term Preterm AB SAB TAB Ectopic Multiple Living  0 0 0 0 0 0 2    # Outcome Date GA Lbr Len/2nd Weight Sex Delivery Anes PTL Lv  2 Current           1 Term 12/12/12 102w0d  7 lb 14 oz (3.572 kg) F Vag-Spont EPI  Y     Comments: no complications, born at [facility].      Past Medical History  Diagnosis Date  . IBS (irritable bowel syndrome)    Past Surgical History  Procedure Laterality Date  . No past surgeries     History reviewed. No pertinent family history.   Exam    BP 107/48 mmHg  Pulse 59  Temp(Src) 98 F (36.7 C)  Wt 159 lb 11.2 oz (72.439 kg)  LMP 03/06/2015 (Approximate) Uterine Size: size equals dates  Pelvic Exam:    Perineum: No Hemorrhoids, Normal Perineum   Vulva: normal   Vagina:  normal mucosa, normal discharge, no palpable nodules   pH: Not done   Cervix: no bleeding following Pap, no cervical motion tenderness and no lesions   Adnexa: normal adnexa and no mass, fullness, tenderness   Bony Pelvis: Adequate  System: Breast:  No nipple retraction or dimpling, No nipple discharge or bleeding, No axillary or supraclavicular adenopathy, Normal to palpation without dominant masses   Skin: normal coloration and turgor, no rashes    Neurologic: negative   Extremities: normal strength, tone, and muscle mass   HEENT neck supple with midline trachea and thyroid without masses   Mouth/Teeth mucous membranes moist, pharynx normal without lesions   Neck supple and no masses   Cardiovascular: regular rate and rhythm, no murmurs or  gallops   Respiratory:  appears well, vitals normal, no respiratory distress, acyanotic, normal RR, neck free of mass or lymphadenopathy, chest clear, no wheezing, crepitations, rhonchi, normal symmetric air entry   Abdomen: soft, non-tender; bowel sounds normal; no masses,  no organomegaly   Urinary: urethral meatus normal      Assessment:    Pregnancy:  25 y.o. G2P1002 at [redacted]w[redacted]d IUP  Patient Active Problem List   Diagnosis Date Noted  . Supervision of normal pregnancy, antepartum 05/23/2015  . Headache 03/08/2015        Plan:     Initial labs drawn.  Pap smear collected. Prenatal vitamins. Problem list reviewed and updated. Genetic Screening discussed First Screen: ordered.  Follow up in 4 weeks.  Marlis Edelson 05/23/2015

## 2015-05-23 NOTE — Addendum Note (Signed)
Addended by: Marlis Edelson on: 05/23/2015 02:51 PM   Modules accepted: Orders

## 2015-05-24 LAB — PRENATAL PROFILE (SOLSTAS)
Antibody Screen: NEGATIVE
Basophils Absolute: 0 10*3/uL (ref 0.0–0.1)
Basophils Relative: 0 % (ref 0–1)
EOS PCT: 1 % (ref 0–5)
Eosinophils Absolute: 0.1 10*3/uL (ref 0.0–0.7)
HEMATOCRIT: 41.1 % (ref 36.0–46.0)
HEP B S AG: NEGATIVE
HIV: NONREACTIVE
Hemoglobin: 13.8 g/dL (ref 12.0–15.0)
LYMPHS ABS: 2.1 10*3/uL (ref 0.7–4.0)
LYMPHS PCT: 20 % (ref 12–46)
MCH: 27.6 pg (ref 26.0–34.0)
MCHC: 33.6 g/dL (ref 30.0–36.0)
MCV: 82.2 fL (ref 78.0–100.0)
MONO ABS: 0.6 10*3/uL (ref 0.1–1.0)
MONOS PCT: 6 % (ref 3–12)
MPV: 8.8 fL (ref 8.6–12.4)
NEUTROS ABS: 7.7 10*3/uL (ref 1.7–7.7)
Neutrophils Relative %: 73 % (ref 43–77)
PLATELETS: 328 10*3/uL (ref 150–400)
RBC: 5 MIL/uL (ref 3.87–5.11)
RDW: 12.7 % (ref 11.5–15.5)
RH TYPE: POSITIVE
RUBELLA: 3.81 {index} — AB (ref <0.90)
WBC: 10.5 10*3/uL (ref 4.0–10.5)

## 2015-05-25 LAB — CYTOLOGY - PAP

## 2015-05-25 LAB — CULTURE, OB URINE
Colony Count: NO GROWTH
Organism ID, Bacteria: NO GROWTH

## 2015-05-26 LAB — HEMOGLOBINOPATHY EVALUATION
HGB A2 QUANT: 2.4 % (ref 2.2–3.2)
Hemoglobin Other: 0 %
Hgb A: 97.6 % (ref 96.8–97.8)
Hgb F Quant: 0 % (ref 0.0–2.0)
Hgb S Quant: 0 %

## 2015-05-26 LAB — CANNABANOIDS (GC/LC/MS), URINE: THC-COOH (GC/LC/MS), ur confirm: 50 ng/mL — AB (ref <5)

## 2015-05-27 LAB — PRESCRIPTION MONITORING PROFILE (19 PANEL)
AMPHETAMINE/METH: NEGATIVE ng/mL
Barbiturate Screen, Urine: NEGATIVE ng/mL
Benzodiazepine Screen, Urine: NEGATIVE ng/mL
Buprenorphine, Urine: NEGATIVE ng/mL
CARISOPRODOL, URINE: NEGATIVE ng/mL
COCAINE METABOLITES: NEGATIVE ng/mL
Creatinine, Urine: 232.68 mg/dL (ref >20.0)
Fentanyl, Ur: NEGATIVE ng/mL
MDMA URINE: NEGATIVE ng/mL
Meperidine, Ur: NEGATIVE ng/mL
Methadone Screen, Urine: NEGATIVE ng/mL
Methaqualone: NEGATIVE ng/mL
Nitrites, Initial: NEGATIVE ug/mL
OPIATE SCREEN, URINE: NEGATIVE ng/mL
OXYCODONE SCRN UR: NEGATIVE ng/mL
PH URINE, INITIAL: 7.6 pH (ref 4.5–8.9)
Phencyclidine, Ur: NEGATIVE ng/mL
Propoxyphene: NEGATIVE ng/mL
TAPENTADOLUR: NEGATIVE ng/mL
TRAMADOL UR: NEGATIVE ng/mL
Zolpidem, Urine: NEGATIVE ng/mL

## 2015-06-01 ENCOUNTER — Other Ambulatory Visit: Payer: Self-pay | Admitting: Family

## 2015-06-01 DIAGNOSIS — Z3A11 11 weeks gestation of pregnancy: Secondary | ICD-10-CM

## 2015-06-01 DIAGNOSIS — Z369 Encounter for antenatal screening, unspecified: Secondary | ICD-10-CM

## 2015-06-02 ENCOUNTER — Encounter (HOSPITAL_COMMUNITY): Payer: Self-pay

## 2015-06-02 ENCOUNTER — Ambulatory Visit (HOSPITAL_COMMUNITY)
Admission: RE | Admit: 2015-06-02 | Discharge: 2015-06-02 | Disposition: A | Discharge status: Home / Self Care | Payer: Managed Care, Other (non HMO) | Source: Ambulatory Visit | Attending: Family | Admitting: Family

## 2015-06-02 ENCOUNTER — Ambulatory Visit (HOSPITAL_COMMUNITY): Admission: RE | Admit: 2015-06-02 | Payer: Managed Care, Other (non HMO) | Source: Ambulatory Visit

## 2015-06-02 VITALS — BP 118/53 | HR 69 | Wt 162.2 lb

## 2015-06-02 DIAGNOSIS — Z369 Encounter for antenatal screening, unspecified: Secondary | ICD-10-CM

## 2015-06-02 DIAGNOSIS — Z3A11 11 weeks gestation of pregnancy: Secondary | ICD-10-CM | POA: Diagnosis not present

## 2015-06-02 DIAGNOSIS — Z36 Encounter for antenatal screening of mother: Secondary | ICD-10-CM | POA: Diagnosis not present

## 2015-06-02 DIAGNOSIS — Z3481 Encounter for supervision of other normal pregnancy, first trimester: Secondary | ICD-10-CM

## 2015-06-14 ENCOUNTER — Ambulatory Visit (HOSPITAL_COMMUNITY)
Admission: RE | Admit: 2015-06-14 | Discharge: 2015-06-14 | Disposition: A | Discharge status: Home / Self Care | Payer: Managed Care, Other (non HMO) | Source: Ambulatory Visit | Attending: Family | Admitting: Family

## 2015-06-14 DIAGNOSIS — Z36 Encounter for antenatal screening of mother: Secondary | ICD-10-CM | POA: Diagnosis not present

## 2015-06-14 DIAGNOSIS — Z3A13 13 weeks gestation of pregnancy: Secondary | ICD-10-CM | POA: Diagnosis not present

## 2015-06-14 DIAGNOSIS — Z369 Encounter for antenatal screening, unspecified: Secondary | ICD-10-CM

## 2015-06-15 ENCOUNTER — Encounter: Payer: Managed Care, Other (non HMO) | Admitting: Advanced Practice Midwife

## 2015-06-20 ENCOUNTER — Ambulatory Visit (INDEPENDENT_AMBULATORY_CARE_PROVIDER_SITE_OTHER): Payer: Managed Care, Other (non HMO) | Admitting: Advanced Practice Midwife

## 2015-06-20 VITALS — BP 111/52 | HR 69 | Wt 165.6 lb

## 2015-06-20 DIAGNOSIS — Z3482 Encounter for supervision of other normal pregnancy, second trimester: Secondary | ICD-10-CM | POA: Diagnosis not present

## 2015-06-20 LAB — POCT URINALYSIS DIP (DEVICE)
BILIRUBIN URINE: NEGATIVE
GLUCOSE, UA: NEGATIVE mg/dL
Hgb urine dipstick: NEGATIVE
KETONES UR: NEGATIVE mg/dL
LEUKOCYTES UA: NEGATIVE
NITRITE: NEGATIVE
Protein, ur: NEGATIVE mg/dL
Specific Gravity, Urine: 1.025 (ref 1.005–1.030)
Urobilinogen, UA: 0.2 mg/dL (ref 0.0–1.0)
pH: 7 (ref 5.0–8.0)

## 2015-06-20 NOTE — Progress Notes (Signed)
Subjective:  Lori Payne is a 24 y.o. G2P1001 at [redacted]w[redacted]d being seen today for ongoing prenatal care.  She is currently monitored for the following issues for this low-risk pregnancy and has Headache and Supervision of normal pregnancy, antepartum on her problem list.  Patient reports no complaints.  Contractions: Not present. Vag. Bleeding: None.  Movement: Present. Denies leaking of fluid.   The following portions of the patient's history were reviewed and updated as appropriate: allergies, current medications, past family history, past medical history, past social history, past surgical history and problem list. Problem list updated.  Objective:   Filed Vitals:   06/20/15 0900  BP: 111/52  Pulse: 69  Weight: 165 lb 9.6 oz (75.116 kg)    Fetal Status: Fetal Heart Rate (bpm): 150   Movement: Present     General:  Alert, oriented and cooperative. Patient is in no acute distress.  Skin: Skin is warm and dry. No rash noted.   Cardiovascular: Normal heart rate noted  Respiratory: Normal respiratory effort, no problems with respiration noted  Abdomen: Soft, gravid, appropriate for gestational age. Pain/Pressure: Absent     Pelvic: Vag. Bleeding: None     Cervical exam deferred        Extremities: Normal range of motion.     Mental Status: Normal mood and affect. Normal behavior. Normal judgment and thought content.   Urinalysis: Urine Protein: Negative Urine Glucose: Negative  Assessment and Plan:  Pregnancy: G2P1001 at [redacted]w[redacted]d  1. Encounter for supervision of other normal pregnancy in second trimester  - [place] MFM OB COMP + 14 WK; Future  Preterm labor symptoms and general obstetric precautions including but not limited to vaginal bleeding, contractions, leaking of fluid and fetal movement were reviewed in detail with the patient. Please refer to After Visit Summary for other counseling recommendations.  Return in about 4 weeks (around 07/18/2015).   Hurshel Party, CNM

## 2015-06-22 ENCOUNTER — Encounter: Payer: Self-pay | Admitting: *Deleted

## 2015-06-22 DIAGNOSIS — Z3480 Encounter for supervision of other normal pregnancy, unspecified trimester: Secondary | ICD-10-CM

## 2015-07-07 ENCOUNTER — Other Ambulatory Visit (HOSPITAL_COMMUNITY): Payer: Self-pay

## 2015-07-17 ENCOUNTER — Ambulatory Visit (HOSPITAL_COMMUNITY)
Admission: RE | Admit: 2015-07-17 | Discharge: 2015-07-17 | Disposition: A | Discharge status: Home / Self Care | Payer: Managed Care, Other (non HMO) | Source: Ambulatory Visit | Attending: Advanced Practice Midwife | Admitting: Advanced Practice Midwife

## 2015-07-17 ENCOUNTER — Ambulatory Visit (INDEPENDENT_AMBULATORY_CARE_PROVIDER_SITE_OTHER): Payer: Managed Care, Other (non HMO) | Admitting: Family Medicine

## 2015-07-17 VITALS — BP 100/52 | HR 56 | Temp 97.9°F | Wt 167.7 lb

## 2015-07-17 DIAGNOSIS — Z3482 Encounter for supervision of other normal pregnancy, second trimester: Secondary | ICD-10-CM

## 2015-07-17 DIAGNOSIS — Z36 Encounter for antenatal screening of mother: Secondary | ICD-10-CM | POA: Diagnosis present

## 2015-07-17 DIAGNOSIS — Z3A18 18 weeks gestation of pregnancy: Secondary | ICD-10-CM | POA: Diagnosis not present

## 2015-07-17 LAB — POCT URINALYSIS DIP (DEVICE)
BILIRUBIN URINE: NEGATIVE
Glucose, UA: NEGATIVE mg/dL
Hgb urine dipstick: NEGATIVE
KETONES UR: NEGATIVE mg/dL
Nitrite: NEGATIVE
PH: 6.5 (ref 5.0–8.0)
Protein, ur: 30 mg/dL — AB
Specific Gravity, Urine: 1.025 (ref 1.005–1.030)
Urobilinogen, UA: 1 mg/dL (ref 0.0–1.0)

## 2015-07-17 NOTE — Progress Notes (Signed)
Breastfeeding tip of the week reviewed. 

## 2015-07-17 NOTE — Patient Instructions (Addendum)
Labor Ladies [email or web address]   Safe Medications in Pregnancy   Acne: Benzoyl Peroxide Salicylic Acid  Backache/Headache: Tylenol: 2 regular strength every 4 hours OR              2 Extra strength every 6 hours  Colds/Coughs/Allergies: Benadryl (alcohol free) 25 mg every 6 hours as needed Breath right strips Claritin Cepacol throat lozenges Chloraseptic throat spray Cold-Eeze- up to three times per day Cough drops, alcohol free Flonase (by prescription only) Guaifenesin Mucinex Robitussin DM (plain only, alcohol free) Saline nasal spray/drops Sudafed (pseudoephedrine) & Actifed ** use only after [redacted] weeks gestation and if you do not have high blood pressure Tylenol Vicks Vaporub Zinc lozenges Zyrtec   Constipation: Colace Ducolax suppositories Fleet enema Glycerin suppositories Metamucil Milk of magnesia Miralax Senokot Smooth move tea  Diarrhea: Kaopectate Imodium A-D  *NO pepto Bismol  Hemorrhoids: Anusol Anusol HC Preparation H Tucks  Indigestion: Tums Maalox Mylanta Zantac  Pepcid  Insomnia: Benadryl (alcohol free) 25mg  every 6 hours as needed Tylenol PM Unisom, no Gelcaps  Leg Cramps: Tums MagGel  Nausea/Vomiting:  Bonine Dramamine Emetrol Ginger extract Sea bands Meclizine  Nausea medication to take during pregnancy:  Unisom (doxylamine succinate 25 mg tablets) Take one tablet daily at bedtime. If symptoms are not adequately controlled, the dose can be increased to a maximum recommended dose of two tablets daily (1/2 tablet in the morning, 1/2 tablet mid-afternoon and one at bedtime). Vitamin B6 100mg  tablets. Take one tablet twice a day (up to 200 mg per day).  Skin Rashes: Aveeno products Benadryl cream or 25mg  every 6 hours as needed Calamine Lotion 1% cortisone cream  Yeast infection: Gyne-lotrimin 7 Monistat 7   **If taking multiple medications, please check labels to avoid  duplicating the same active ingredients **take medication as directed on the label ** Do not exceed 4000 mg of tylenol in 24 hours **Do not take medications that contain aspirin or ibuprofen

## 2015-07-17 NOTE — Progress Notes (Deleted)
Subjective:  Michel HarrowSabrina Legault is a 25 y.o. G2P1001 at 4169w1d being seen today for ongoing prenatal care.  She is currently monitored for the following issues for this {Blank single:[address]::"high-risk","low-risk"} pregnancy and has Headache and Supervision of normal pregnancy, antepartum on her problem list.  Patient reports {sx:14538}.  Contractions: Not present. Vag. Bleeding: None.  Movement: Present. Denies leaking of fluid.   The following portions of the patient's history were reviewed and updated as appropriate: allergies, current medications, past family history, past medical history, past social history, past surgical history and problem list. Problem list updated.  Objective:   Filed Vitals:   07/17/15 1040  BP: 100/52  Pulse: 56  Temp: 97.9 F (36.6 C)  Weight: 167 lb 11.2 oz (76.068 kg)    Fetal Status: Fetal Heart Rate (bpm): 155   Movement: Present     General:  Alert, oriented and cooperative. Patient is in no acute distress.  Skin: Skin is warm and dry. No rash noted.   Cardiovascular: Normal heart rate noted  Respiratory: Normal respiratory effort, no problems with respiration noted  Abdomen: Soft, gravid, appropriate for gestational age. Pain/Pressure: Present     Pelvic: Vag. Bleeding: None     {Blank single:19197::"Cervical exam performed","Cervical exam deferred"}        Extremities: Normal range of motion.  Edema: None  Mental Status: Normal mood and affect. Normal behavior. Normal judgment and thought content.   Urinalysis:      Assessment and Plan:  Pregnancy: G2P1001 at 4669w1d  1. Supervision of normal pregnancy, antepartum, second trimester ***  {Blank single:19197::"Term","Preterm"} labor symptoms and general obstetric precautions including but not limited to vaginal bleeding, contractions, leaking of fluid and fetal movement were reviewed in detail with the patient. Please refer to After Visit Summary for other counseling recommendations.  No Follow-up on  file.   Pincus LargeJazma Y Kollyns Mickelson, DO

## 2015-07-17 NOTE — Progress Notes (Signed)
Subjective:  Lori Payne is a 25 y.o. G2P1001 at 5980w1d being seen today for ongoing prenatal care.  She is currently monitored for the following issues for this low-risk pregnancy and has Headache and Supervision of normal pregnancy, antepartum on her problem list.  Patient reports no complaints.  Contractions: Not present. Vag. Bleeding: None.  Movement: Present. Denies leaking of fluid.   The following portions of the patient's history were reviewed and updated as appropriate: allergies, current medications, past family history, past medical history, past social history, past surgical history and problem list. Problem list updated.  Objective:   Filed Vitals:   07/17/15 1040  BP: 100/52  Pulse: 56  Temp: 97.9 F (36.6 C)  Weight: 167 lb 11.2 oz (76.068 kg)    Fetal Status: Fetal Heart Rate (bpm): 155   Movement: Present     General:  Alert, oriented and cooperative. Patient is in no acute distress.  Skin: Skin is warm and dry. No rash noted.   Cardiovascular: Normal heart rate noted  Respiratory: Normal respiratory effort, no problems with respiration noted  Abdomen: Soft, gravid, appropriate for gestational age. Pain/Pressure: Present     Pelvic: Vag. Bleeding: None     Cervical exam deferred        Extremities: Normal range of motion.  Edema: None  Mental Status: Normal mood and affect. Normal behavior. Normal judgment and thought content.   Urinalysis:      Assessment and Plan:  Pregnancy: G2P1001 at 8580w1d  1. Supervision of normal pregnancy, antepartum, second trimester -updated box with anatomy scan and reviewed labs -discussed MSK pain in pregnancy and recommend stretches. Prn tylenol also appropriate. Recommend pregnancy pillow -she would be interested in a water birth. I will have her scheduled with CNM to discuss process but today I briefly reviewed the need to take a class and provide her own supplies and provided support that this is good coping method esp since  the patient did not like her epidural last time.   Preterm labor symptoms and general obstetric precautions including but not limited to vaginal bleeding, contractions, leaking of fluid and fetal movement were reviewed in detail with the patient. Please refer to After Visit Summary for other counseling recommendations.  Return in about 4 weeks (around 08/14/2015) for Routine prenatal care- schedule with CM to discuss water birth.  Future Appointments Date Time Provider Department Center  08/16/2015 10:20 AM [place], CNM [facility]    Federico FlakeKimberly Niles Tyreona Panjwani, MD

## 2015-07-19 ENCOUNTER — Encounter (HOSPITAL_COMMUNITY): Payer: Self-pay

## 2015-07-19 ENCOUNTER — Inpatient Hospital Stay (HOSPITAL_COMMUNITY)
Admission: AD | Admit: 2015-07-19 | Discharge: 2015-07-19 | Disposition: A | Discharge status: Home / Self Care | Payer: Managed Care, Other (non HMO) | Source: Ambulatory Visit | Attending: Obstetrics & Gynecology | Admitting: Obstetrics & Gynecology

## 2015-07-19 DIAGNOSIS — R42 Dizziness and giddiness: Secondary | ICD-10-CM | POA: Diagnosis present

## 2015-07-19 DIAGNOSIS — K589 Irritable bowel syndrome without diarrhea: Secondary | ICD-10-CM | POA: Insufficient documentation

## 2015-07-19 DIAGNOSIS — O26892 Other specified pregnancy related conditions, second trimester: Secondary | ICD-10-CM | POA: Insufficient documentation

## 2015-07-19 DIAGNOSIS — Z3A18 18 weeks gestation of pregnancy: Secondary | ICD-10-CM | POA: Insufficient documentation

## 2015-07-19 DIAGNOSIS — Z87891 Personal history of nicotine dependence: Secondary | ICD-10-CM | POA: Diagnosis not present

## 2015-07-19 DIAGNOSIS — M545 Low back pain, unspecified: Secondary | ICD-10-CM

## 2015-07-19 DIAGNOSIS — O99512 Diseases of the respiratory system complicating pregnancy, second trimester: Secondary | ICD-10-CM | POA: Diagnosis not present

## 2015-07-19 DIAGNOSIS — O9989 Other specified diseases and conditions complicating pregnancy, childbirth and the puerperium: Secondary | ICD-10-CM | POA: Diagnosis not present

## 2015-07-19 LAB — URINALYSIS, ROUTINE W REFLEX MICROSCOPIC
Bilirubin Urine: NEGATIVE
GLUCOSE, UA: NEGATIVE mg/dL
Hgb urine dipstick: NEGATIVE
KETONES UR: NEGATIVE mg/dL
Leukocytes, UA: NEGATIVE
NITRITE: NEGATIVE
Protein, ur: NEGATIVE mg/dL
Specific Gravity, Urine: 1.015 (ref 1.005–1.030)
pH: 8 (ref 5.0–8.0)

## 2015-07-19 LAB — CBC
HCT: 35.2 % — ABNORMAL LOW (ref 36.0–46.0)
HEMOGLOBIN: 11.6 g/dL — AB (ref 12.0–15.0)
MCH: 27.6 pg (ref 26.0–34.0)
MCHC: 33 g/dL (ref 30.0–36.0)
MCV: 83.6 fL (ref 78.0–100.0)
Platelets: 251 10*3/uL (ref 150–400)
RBC: 4.21 MIL/uL (ref 3.87–5.11)
RDW: 13.6 % (ref 11.5–15.5)
WBC: 8.8 10*3/uL (ref 4.0–10.5)

## 2015-07-19 MED ORDER — MECLIZINE HCL 12.5 MG PO TABS: 12.5000 mg | ORAL_TABLET | Freq: Three times a day (TID) | ORAL | Status: DC | PRN

## 2015-07-19 MED ORDER — CYCLOBENZAPRINE HCL 10 MG PO TABS: 10.0000 mg | ORAL_TABLET | Freq: Two times a day (BID) | ORAL | Status: DC | PRN

## 2015-07-19 MED ORDER — ACETAMINOPHEN 325 MG PO TABS
650.0000 mg | ORAL_TABLET | Freq: Once | ORAL | Status: AC
  Administered 2015-07-19: 650 mg via ORAL
  Filled 2015-07-19: qty 2

## 2015-07-19 NOTE — MAU Note (Signed)
Pt C/O weakness since last night, seeing spots, lower back pain.  Denies abd pain or bleeding.

## 2015-07-19 NOTE — MAU Note (Signed)
Urine in lab 

## 2015-07-19 NOTE — Discharge Instructions (Signed)
Back Pain in Pregnancy °Back pain during pregnancy is common. It happens in about half of all pregnancies. It is important for you and your baby that you remain active during your pregnancy. If you feel that back pain is not allowing you to remain active or sleep well, it is time to see your caregiver. Back pain may be caused by several factors related to changes during your pregnancy. Fortunately, unless you had trouble with your back before your pregnancy, the pain is likely to get better after you deliver. °Low back pain usually occurs between the fifth and seventh months of pregnancy. It can, however, happen in the first couple months. Factors that increase the risk of back problems include:  °· Previous back problems. °· Injury to your back. °· Having twins or multiple births. °· A chronic cough. °· Stress. °· Job-related repetitive motions. °· Muscle or spinal disease in the back. °· Family history of back problems, ruptured (herniated) discs, or osteoporosis. °· Depression, anxiety, and panic attacks. °CAUSES  °· When you are pregnant, your body produces a hormone called relaxin. This hormone makes the ligaments connecting the low back and pubic bones more flexible. This flexibility allows the baby to be delivered more easily. When your ligaments are loose, your muscles need to work harder to support your back. Soreness in your back can come from tired muscles. Soreness can also come from back tissues that are irritated since they are receiving less support. °· As the baby grows, it puts pressure on the nerves and blood vessels in your pelvis. This can cause back pain. °· As the baby grows and gets heavier during pregnancy, the uterus pushes the stomach muscles forward and changes your center of gravity. This makes your back muscles work harder to maintain good posture. °SYMPTOMS  °Lumbar pain during pregnancy °Lumbar pain during pregnancy usually occurs at or above the waist in the center of the back. There  may be pain and numbness that radiates into your leg or foot. This is similar to low back pain experienced by non-pregnant women. It usually increases with sitting for long periods of time, standing, or repetitive lifting. Tenderness may also be present in the muscles along your upper back. °Posterior pelvic pain during pregnancy °Pain in the back of the pelvis is more common than lumbar pain in pregnancy. It is a deep pain felt in your side at the waistline, or across the tailbone (sacrum), or in both places. You may have pain on one or both sides. This pain can also go into the buttocks and backs of the upper thighs. Pubic and groin pain may also be present. The pain does not quickly resolve with rest, and morning stiffness may also be present. °Pelvic pain during pregnancy can be brought on by most activities. A high level of fitness before and during pregnancy may or may not prevent this problem. Labor pain is usually 1 to 2 minutes apart, lasts for about 1 minute, and involves a bearing down feeling or pressure in your pelvis. However, if you are at term with the pregnancy, constant low back pain can be the beginning of early labor, and you should be aware of this. °DIAGNOSIS  °X-rays of the back should not be done during the first 12 to 14 weeks of the pregnancy and only when absolutely necessary during the rest of the pregnancy. MRIs do not give off radiation and are safe during pregnancy. MRIs also should only be done when absolutely necessary. °HOME CARE INSTRUCTIONS °· Exercise   as directed by your caregiver. Exercise is the most effective way to prevent or manage back pain. If you have a back problem, it is especially important to avoid sports that require sudden body movements. Swimming and walking are great activities. °· Do not stand in one place for long periods of time. °· Do not wear high heels. °· Sit in chairs with good posture. Use a pillow on your lower back if necessary. Make sure your head  rests over your shoulders and is not hanging forward. °· Try sleeping on your side, preferably the left side, with a pillow or two between your legs. If you are sore after a night's rest, your bed may be too soft. Try placing a board between your mattress and box spring. °· Listen to your body when lifting. If you are experiencing pain, ask for help or try bending your knees more so you can use your leg muscles rather than your back muscles. Squat down when picking up something from the floor. Do not bend over. °· Eat a healthy diet. Try to gain weight within your caregiver's recommendations. °· Use heat or cold packs 3 to 4 times a day for 15 minutes to help with the pain. °· Only take over-the-counter or prescription medicines for pain, discomfort, or fever as directed by your caregiver. °Sudden (acute) back pain °· Use bed rest for only the most extreme, acute episodes of back pain. Prolonged bed rest over 48 hours will aggravate your condition. °· Ice is very effective for acute conditions. °· Put ice in a plastic bag. °· Place a towel between your skin and the bag. °· Leave the ice on for 10 to 20 minutes every 2 hours, or as needed. °· Using heat packs for 30 minutes prior to activities is also helpful. °Continued back pain °See your caregiver if you have continued problems. Your caregiver can help or refer you for appropriate physical therapy. With conditioning, most back problems can be avoided. Sometimes, a more serious issue may be the cause of back pain. You should be seen right away if new problems seem to be developing. Your caregiver may recommend: °· A maternity girdle. °· An elastic sling. °· A back brace. °· A massage therapist or acupuncture. °SEEK MEDICAL CARE IF:  °· You are not able to do most of your daily activities, even when taking the pain medicine you were given. °· You need a referral to a physical therapist or chiropractor. °· You want to try acupuncture. °SEEK IMMEDIATE MEDICAL CARE  IF: °· You develop numbness, tingling, weakness, or problems with the use of your arms or legs. °· You develop severe back pain that is no longer relieved with medicines. °· You have a sudden change in bowel or bladder control. °· You have increasing pain in other areas of the body. °· You develop shortness of breath, dizziness, or fainting. °· You develop nausea, vomiting, or sweating. °· You have back pain which is similar to labor pains. °· You have back pain along with your water breaking or vaginal bleeding. °· You have back pain or numbness that travels down your leg. °· Your back pain developed after you fell. °· You develop pain on one side of your back. You may have a kidney stone. °· You see blood in your urine. You may have a bladder infection or kidney stone. °· You have back pain with blisters. You may have shingles. °Back pain is fairly common during pregnancy but should not be accepted as just part of   the process. Back pain should always be treated as soon as possible. This will make your pregnancy as pleasant as possible.   This information is not intended to replace advice given to you by your health care provider. Make sure you discuss any questions you have with your health care provider.   Document Released: 07/24/2005 Document Revised: 07/08/2011 Document Reviewed: 09/04/2010 Elsevier Interactive Patient Education 2016 Else[employer] Inc[employer]    Dizziness Dizziness is a common problem. It is a feeling of unsteadiness or light-headedness. You may feel like you are about to faint. Dizziness can lead to injury if you stumble or fall. Anyone can become dizzy, but dizziness is more common in older adults. This condition can be caused by a number of things, including medicines, dehydration, or illness. HOME CARE INSTRUCTIONS Taking these steps may help with your condition: Eating and Drinking  Drink enough fluid to keep your urine clear or pale yellow. This helps to keep you from becoming  dehydrated. Try to drink more clear fluids, such as water.  Do not drink alcohol.  Limit your caffeine intake if directed by your health care provider.  Limit your salt intake if directed by your health care provider. Activity  Avoid making quick movements.  Rise slowly from chairs and steady yourself until you feel okay.  In the morning, first sit up on the side of the bed. When you feel okay, stand slowly while you hold onto something until you know that your balance is fine.  Move your legs often if you need to stand in one place for a long time. Tighten and relax your muscles in your legs while you are standing.  Do not drive or operate heavy machinery if you feel dizzy.  Avoid bending down if you feel dizzy. Place items in your home so that they are easy for you to reach without leaning over. Lifestyle  Do not use any tobacco products, including cigarettes, chewing tobacco, or electronic cigarettes. If you need help quitting, ask your health care provider.  Try to reduce your stress level, such as with yoga or meditation. Talk with your health care provider if you need help. General Instructions  Watch your dizziness for any changes.  Take medicines only as directed by your health care provider. Talk with your health care provider if you think that your dizziness is caused by a medicine that you are taking.  Tell a friend or a family member that you are feeling dizzy. If he or she notices any changes in your behavior, have this person call your health care provider.  Keep all follow-up visits as directed by your health care provider. This is important. SEEK MEDICAL CARE IF:  Your dizziness does not go away.  Your dizziness or light-headedness gets worse.  You feel nauseous.  You have reduced hearing.  You have new symptoms.  You are unsteady on your feet or you feel like the room is spinning. SEEK IMMEDIATE MEDICAL CARE IF:  You vomit or have diarrhea and are  unable to eat or drink anything.  You have problems talking, walking, swallowing, or using your arms, hands, or legs.  You feel generally weak.  You are not thinking clearly or you have trouble forming sentences. It may take a friend or family member to notice this.  You have chest pain, abdominal pain, shortness of breath, or sweating.  Your vision changes.  You notice any bleeding.  You have a headache.  You have neck pain or a stiff  neck.  You have a fever.   This information is not intended to replace advice given to you by your health care provider. Make sure you discuss any questions you have with your health care provider.   Document Released: 10/09/2000 Document Revised: 08/30/2014 Document Reviewed: 04/11/2014 Elsevier Interactive Patient Education [employer].

## 2015-07-19 NOTE — MAU Provider Note (Signed)
History     CSN: [id number]  Arrival date and time: 07/19/15 7829   First Provider Initiated Contact with Patient 07/19/15 0840         Chief Complaint  Patient presents with  . Near Syncope  . Back Pain   HPI  Lori Payne is a 25 y.o. G2P1001 at [redacted]w[redacted]d who presents for back pain, dizziness, spots in vision, & fatigue.  Reports low back pain throughout pregnancy. Has worsened since yesterday. Pain is right low back to right hip. Describes as constant dull pain that she rates as 5/10. Has not treated. Lying on right side makes pain worse.  Denies injury, numbness/tingling, incontinence. Pain does not radiate.   Reports episode of dizziness last night that was worse with movement. States not dizzy since then but does see spots in peripheral vision when she moves her head too fast.  Denies headache, CP, SOB, fever/chills, or syncope.   OB History    Gravida Para Term Preterm AB TAB SAB Ectopic Multiple Living   0 0 0 0 0 0 1      Past Medical History  Diagnosis Date  . IBS (irritable bowel syndrome)     Past Surgical History  Procedure Laterality Date  . No past surgeries      Family History  Problem Relation Age of Onset  . Hypertension Mother   . Arthritis Mother   . Arthritis Father   . Arthritis Maternal Grandmother   . Arthritis Paternal Grandmother     Social History  Substance Use Topics  . Smoking status: Former Smoker    Types: Cigarettes    Quit date: 04/22/2015  . Smokeless tobacco: Never Used  . Alcohol Use: No     Comment: social    Allergies:  Allergies  Allergen Reactions  . Hydrocodone Nausea Only    nausea  . Dilaudid [Hydromorphone Hcl] Itching    Prescriptions prior to admission  Medication Sig Dispense Refill Last Dose  . metoCLOPramide (REGLAN) 10 MG tablet Take 1 tablet (10 mg total) by mouth every 6 (six) hours. (Patient not taking: Reported on 06/20/2015) 30 tablet 0 Not Taking  . metoCLOPramide (REGLAN) 10 MG tablet  Take 1 tablet (10 mg total) by mouth 3 (three) times daily with meals. (Patient not taking: Reported on 06/20/2015) 90 tablet 1 Not Taking  . OVER THE COUNTER MEDICATION Take 2 tablets by mouth daily. Reported on 07/17/2015   Not Taking  . Prenatal Vit-Fe Fumarate-FA (MULTIVITAMIN-PRENATAL) 27-0.8 MG TABS tablet Take 1 tablet by mouth daily at 12 noon. 30 each 0 Taking  . promethazine (PHENERGAN) 25 MG tablet Take 0.5-1 tablets (12.5-25 mg total) by mouth every 6 (six) hours as needed for nausea. (Patient not taking: Reported on 06/20/2015) 30 tablet 2 Not Taking    Review of Systems  Constitutional: Positive for malaise/fatigue. Negative for fever and chills.  HENT: Negative for tinnitus.   Respiratory: Negative.   Cardiovascular: Negative.   Gastrointestinal: Negative.   Genitourinary: Negative.   Musculoskeletal: Positive for back pain.  Neurological: Positive for dizziness. Negative for loss of consciousness, weakness and headaches.   Physical Exam   Blood pressure 110/54, pulse 64, temperature 98 F (36.7 C), temperature source Oral, resp. rate 18, height  (1.702 m), weight 167 lb (75.751 kg), last menstrual period 03/06/2015.  Physical Exam  Nursing note and vitals reviewed. Constitutional: She is oriented to person, place, and time. She appears well-developed and well-nourished. No distress.  HENT:  Head: Normocephalic and atraumatic.  Eyes: Conjunctivae are normal. Right eye exhibits no discharge. Left eye exhibits no discharge. No scleral icterus.  Neck: Normal range of motion.  Cardiovascular: Normal rate, regular rhythm and normal heart sounds.   No murmur heard. Respiratory: Effort normal and breath sounds normal. No respiratory distress. She has no wheezes.  GI: Soft. Bowel sounds are normal. There is no tenderness.  Musculoskeletal: Normal range of motion. She exhibits no edema or tenderness.  Neurological: She is alert and oriented to person, place, and time. She has  normal reflexes. No cranial nerve deficit. She exhibits normal muscle tone.  Skin: Skin is warm and dry. She is not diaphoretic.  Psychiatric: She has a normal mood and affect. Her behavior is normal. Judgment and thought content normal.    MAU Course  Procedures Results for orders placed or performed during the hospital encounter of 07/19/15 (from the past 24 hour(s))  Urinalysis, Routine w reflex microscopic (not at [facility])     Status: Abnormal   Collection Time: 07/19/15  7:50 AM  Result Value Ref Range   Color, Urine YELLOW YELLOW   APPearance CLOUDY (A) CLEAR   Specific Gravity, Urine 1.015 1.005 - 1.030   pH 8.0 5.0 - 8.0   Glucose, UA NEGATIVE NEGATIVE mg/dL   Hgb urine dipstick NEGATIVE NEGATIVE   Bilirubin Urine NEGATIVE NEGATIVE   Ketones, ur NEGATIVE NEGATIVE mg/dL   Protein, ur NEGATIVE NEGATIVE mg/dL   Nitrite NEGATIVE NEGATIVE   Leukocytes, UA NEGATIVE NEGATIVE  CBC     Status: Abnormal   Collection Time: 07/19/15  8:15 AM  Result Value Ref Range   WBC 8.8 4.0 - 10.5 K/uL   RBC 4.21 3.87 - 5.11 MIL/uL   Hemoglobin 11.6 (L) 12.0 - 15.0 g/dL   HCT 09.835.2 (L) 11.936.0 - 14.746.0 %   MCV 83.6 78.0 - 100.0 fL   MCH 27.6 26.0 - 34.0 pg   MCHC 33.0 30.0 - 36.0 g/dL   RDW 82.913.6 56.211.5 - 13.015.5 %   Platelets 251 150 - 400 K/uL    MDM FHT 150 by doppler Vital signs stable Hemoglobin stable No dizziness today Tylenol 650 mg PO  Assessment and Plan  A/P:  Right sided low back pain without sciatica -rx flexeril -apply heat to affected area -use pillow between legs while lying on side -encouraged use of maternity support belt  Dizziness -rx meclezine  -increase fluid intake -slow position changes  Discussed reasons to return to MAU Keep follow up with OB   Follow-up Information    [facility]   Specialty:  Obstetrics and Gynecology   Why:  For scheduled prenatal appointment   Contact information:   [address] [place]  [place] [id number] [phone]      Follow [facility] with [facility].   Why:  For pregnancy related emergencies   Contact information:   [address] [id number] [place]c [place] [id number] [phone]        Judeth Hornrin Dhruv Christina 07/19/2015, 8:39 AM

## 2015-08-16 ENCOUNTER — Encounter: Payer: Self-pay | Admitting: Advanced Practice Midwife

## 2015-08-16 ENCOUNTER — Ambulatory Visit (INDEPENDENT_AMBULATORY_CARE_PROVIDER_SITE_OTHER): Payer: Managed Care, Other (non HMO) | Admitting: Certified Nurse Midwife

## 2015-08-16 VITALS — BP 107/64 | HR 85 | Wt 177.9 lb

## 2015-08-16 DIAGNOSIS — Z3482 Encounter for supervision of other normal pregnancy, second trimester: Secondary | ICD-10-CM | POA: Diagnosis not present

## 2015-08-16 LAB — POCT URINALYSIS DIP (DEVICE)
Bilirubin Urine: NEGATIVE
Glucose, UA: NEGATIVE mg/dL
KETONES UR: NEGATIVE mg/dL
Leukocytes, UA: NEGATIVE
NITRITE: NEGATIVE
PH: 6.5 (ref 5.0–8.0)
PROTEIN: NEGATIVE mg/dL
Specific Gravity, Urine: 1.01 (ref 1.005–1.030)
Urobilinogen, UA: 0.2 mg/dL (ref 0.0–1.0)

## 2015-08-16 MED ORDER — CYCLOBENZAPRINE HCL 10 MG PO TABS: 10.0000 mg | ORAL_TABLET | Freq: Two times a day (BID) | ORAL | Status: DC | PRN

## 2015-08-16 NOTE — Patient Instructions (Signed)

## 2015-08-16 NOTE — Progress Notes (Signed)
Subjective:  Lori Payne is a 25 y.o. G2P1001 at [redacted]w[redacted]d being seen today for ongoing prenatal care.  She is currently monitored for the following issues for this low-risk pregnancy and has Headache and Supervision of normal pregnancy, antepartum on her problem list.  Patient reports no complaints.  Contractions: Not present. Vag. Bleeding: None.  Movement: Present. Denies leaking of fluid.   The following portions of the patient's history were reviewed and updated as appropriate: allergies, current medications, past family history, past medical history, past social history, past surgical history and problem list. Problem list updated.  Objective:   Filed Vitals:   08/16/15 1138  BP: 107/64  Pulse: 85  Weight: 177 lb 14.4 oz (80.695 kg)    Fetal Status: Fetal Heart Rate (bpm): 148   Movement: Present     General:  Alert, oriented and cooperative. Patient is in no acute distress.  Skin: Skin is warm and dry. No rash noted.   Cardiovascular: Normal heart rate noted  Respiratory: Normal respiratory effort, no problems with respiration noted  Abdomen: Soft, gravid, appropriate for gestational age. Pain/Pressure: Absent     Pelvic: Vag. Bleeding: None     Cervical exam deferred        Extremities: Normal range of motion.  Edema: Trace  Mental Status: Normal mood and affect. Normal behavior. Normal judgment and thought content.   Urinalysis: Urine Protein: Negative Urine Glucose: Negative  Assessment and Plan:  Pregnancy: G2P1001 at [redacted]w[redacted]d  1. Supervision of normal pregnancy, antepartum, second trimester  Wants refill on muscle relaxer Preterm labor symptoms and general obstetric precautions including but not limited to vaginal bleeding, contractions, leaking of fluid and fetal movement were reviewed in detail with the patient. Please refer to After Visit Summary for other counseling recommendations.  Return in about 4 weeks (around 09/13/2015).   Rhea PinkLori A Clemmons, CNM

## 2015-08-28 ENCOUNTER — Encounter (HOSPITAL_COMMUNITY): Payer: Self-pay

## 2015-08-28 ENCOUNTER — Inpatient Hospital Stay (HOSPITAL_COMMUNITY)
Admission: AD | Admit: 2015-08-28 | Discharge: 2015-08-28 | Disposition: A | Discharge status: Home / Self Care | Payer: Managed Care, Other (non HMO) | Source: Ambulatory Visit | Attending: Family Medicine | Admitting: Family Medicine

## 2015-08-28 DIAGNOSIS — Z3A24 24 weeks gestation of pregnancy: Secondary | ICD-10-CM | POA: Insufficient documentation

## 2015-08-28 DIAGNOSIS — K589 Irritable bowel syndrome without diarrhea: Secondary | ICD-10-CM | POA: Diagnosis not present

## 2015-08-28 DIAGNOSIS — M549 Dorsalgia, unspecified: Secondary | ICD-10-CM | POA: Diagnosis not present

## 2015-08-28 DIAGNOSIS — O26892 Other specified pregnancy related conditions, second trimester: Secondary | ICD-10-CM | POA: Insufficient documentation

## 2015-08-28 DIAGNOSIS — R109 Unspecified abdominal pain: Secondary | ICD-10-CM | POA: Diagnosis not present

## 2015-08-28 DIAGNOSIS — O26899 Other specified pregnancy related conditions, unspecified trimester: Secondary | ICD-10-CM

## 2015-08-28 DIAGNOSIS — O9989 Other specified diseases and conditions complicating pregnancy, childbirth and the puerperium: Secondary | ICD-10-CM

## 2015-08-28 DIAGNOSIS — Z87891 Personal history of nicotine dependence: Secondary | ICD-10-CM | POA: Diagnosis not present

## 2015-08-28 DIAGNOSIS — R1030 Lower abdominal pain, unspecified: Secondary | ICD-10-CM | POA: Insufficient documentation

## 2015-08-28 DIAGNOSIS — Z3482 Encounter for supervision of other normal pregnancy, second trimester: Secondary | ICD-10-CM

## 2015-08-28 LAB — URINALYSIS, ROUTINE W REFLEX MICROSCOPIC
Bilirubin Urine: NEGATIVE
GLUCOSE, UA: NEGATIVE mg/dL
Hgb urine dipstick: NEGATIVE
KETONES UR: 15 mg/dL — AB
NITRITE: NEGATIVE
PH: 5.5 (ref 5.0–8.0)
PROTEIN: NEGATIVE mg/dL
Specific Gravity, Urine: <1.005 — ABNORMAL LOW (ref 1.005–1.030)

## 2015-08-28 LAB — URINE MICROSCOPIC-ADD ON

## 2015-08-28 NOTE — MAU Provider Note (Signed)
History   G2P1001 @ 24.1 wks in with back pain and low abd pain for several days.also isolated case of pink tinged spotting.states had sex 2 days ago.. Denies any active bleeding at present time.  CSN: [id number]  Arrival date & time 08/28/15  1210   First Provider Initiated Contact with Patient 08/28/15 1337      Chief Complaint  Patient presents with  . Abdominal Cramping    HPI  Past Medical History  Diagnosis Date  . IBS (irritable bowel syndrome)     Past Surgical History  Procedure Laterality Date  . No past surgeries      Family History  Problem Relation Age of Onset  . Hypertension Mother   . Arthritis Mother   . Arthritis Father   . Arthritis Maternal Grandmother   . Arthritis Paternal Grandmother     Social History  Substance Use Topics  . Smoking status: Former Smoker    Types: Cigarettes    Quit date: 04/22/2015  . Smokeless tobacco: Never Used  . Alcohol Use: No     Comment: social    OB History    Gravida Para Term Preterm AB TAB SAB Ectopic Multiple Living   2 1 1  0 0 0 0 0 0 1      Review of Systems  Constitutional: Negative.   HENT: Negative.   Eyes: Negative.   Respiratory: Negative.   Cardiovascular: Negative.   Gastrointestinal: Positive for abdominal pain.  Endocrine: Negative.   Genitourinary: Positive for vaginal bleeding.  Musculoskeletal: Positive for back pain.  Skin: Negative.   Allergic/Immunologic: Negative.   Neurological: Negative.   Hematological: Negative.   Psychiatric/Behavioral: Negative.     Allergies  Hydrocodone and Dilaudid  Home Medications  No current outpatient prescriptions on file.  BP 113/63 mmHg  Pulse 73  Temp(Src) 98.3 F (36.8 C) (Oral)  Resp 18  Ht 5\' 7"  (1.702 m)  Wt 177 lb (80.287 kg)  BMI 27.72 kg/m2  LMP 03/06/2015 (Approximate)  Physical Exam  Constitutional: She is oriented to person, place, and time. She appears well-developed and well-nourished.  HENT:  Head: Normocephalic.   Eyes: Pupils are equal, round, and reactive to light.  Neck: Normal range of motion.  Cardiovascular: Normal rate, regular rhythm, normal heart sounds and intact distal pulses.   Pulmonary/Chest: Effort normal and breath sounds normal.  Abdominal: Soft. Bowel sounds are normal.  Genitourinary: Vagina normal and uterus normal.  Musculoskeletal: Normal range of motion.  Neurological: She is alert and oriented to person, place, and time. She has normal reflexes.  Skin: Skin is warm and dry.  Psychiatric: She has a normal mood and affect. Her behavior is normal. Judgment and thought content normal.    MAU Course  Procedures (including critical care time)  Labs Reviewed  URINALYSIS, ROUTINE W REFLEX MICROSCOPIC (NOT AT [facility])   No results found.   No diagnosis found.    MDM  SVE firm/cl/post/high. No bleeding noted on glove with exam. /fhr pattern reassurring, fetus active. Will monitie for uc's and if no contractions will d/c home.

## 2015-08-28 NOTE — MAU Note (Signed)
Pt c/o cramping for the last couple days. Pt had one episode of spotting this morning. Pt had intercourse two days ago. Pt states baby is moving normally. Pt denies leaking of fluid.

## 2015-08-28 NOTE — Discharge Instructions (Signed)

## 2015-09-15 ENCOUNTER — Encounter: Payer: Self-pay | Admitting: Advanced Practice Midwife

## 2015-09-15 ENCOUNTER — Ambulatory Visit (INDEPENDENT_AMBULATORY_CARE_PROVIDER_SITE_OTHER): Payer: Managed Care, Other (non HMO) | Admitting: Advanced Practice Midwife

## 2015-09-15 VITALS — BP 111/55 | HR 74 | Wt 178.3 lb

## 2015-09-15 DIAGNOSIS — Z3482 Encounter for supervision of other normal pregnancy, second trimester: Secondary | ICD-10-CM

## 2015-09-15 LAB — POCT URINALYSIS DIP (DEVICE)
Bilirubin Urine: NEGATIVE
GLUCOSE, UA: NEGATIVE mg/dL
Hgb urine dipstick: NEGATIVE
KETONES UR: NEGATIVE mg/dL
Nitrite: NEGATIVE
PH: 6 (ref 5.0–8.0)
Protein, ur: 30 mg/dL — AB
Specific Gravity, Urine: 1.025 (ref 1.005–1.030)
Urobilinogen, UA: 0.2 mg/dL (ref 0.0–1.0)

## 2015-09-15 NOTE — Progress Notes (Signed)
Subjective:  Lori Payne is a 25 y.o. G2P1001 at 3236w5d being seen today for ongoing prenatal care.  She is currently monitored for the following issues for this low-risk pregnancy and has Headache and Supervision of normal pregnancy, antepartum on her problem list.  Patient reports occasional contractions.  Contractions: Irritability. Vag. Bleeding: None.  Movement: Present. Denies leaking of fluid.   The following portions of the patient's history were reviewed and updated as appropriate: allergies, current medications, past family history, past medical history, past social history, past surgical history and problem list. Problem list updated.  Objective:   Filed Vitals:   09/15/15 1101  BP: 111/55  Pulse: 74    Fetal Status:     Movement: Present     General:  Alert, oriented and cooperative. Patient is in no acute distress.  Skin: Skin is warm and dry. No rash noted.   Cardiovascular: Normal heart rate noted  Respiratory: Normal respiratory effort, no problems with respiration noted  Abdomen: Soft, gravid, appropriate for gestational age. Pain/Pressure: Present     Pelvic: Vag. Bleeding: None     Cervical exam deferred        Extremities: Normal range of motion.  Edema: Trace  Mental Status: Normal mood and affect. Normal behavior. Normal judgment and thought content.   Urinalysis: Urine Protein: 1+ Urine Glucose: Negative  Assessment and Plan:  Pregnancy: G2P1001 at 4736w5d  1. Supervision of normal pregnancy, antepartum, second trimester   Preterm labor symptoms and general obstetric precautions including but not limited to vaginal bleeding, contractions, leaking of fluid and fetal movement were reviewed in detail with the patient. Please refer to After Visit Summary for other counseling recommendations.  Return in about 2 weeks (around 09/29/2015) for ROB/GTT.   Dorathy KinsmanVirginia Huisman, CNM

## 2015-09-15 NOTE — Patient Instructions (Addendum)
Preterm Labor Information Preterm labor is when labor starts at less than 37 weeks of pregnancy. The normal length of a pregnancy is 39 to 41 weeks. CAUSES Often, there is no identifiable underlying cause as to why a woman goes into preterm labor. One of the most common known causes of preterm labor is infection. Infections of the uterus, cervix, vagina, amniotic sac, bladder, kidney, or even the lungs (pneumonia) can cause labor to start. Other suspected causes of preterm labor include:   Urogenital infections, such as yeast infections and bacterial vaginosis.   Uterine abnormalities (uterine shape, uterine septum, fibroids, or bleeding from the placenta).   A cervix that has been operated on (it may fail to stay closed).   Malformations in the fetus.   Multiple gestations (twins, triplets, and so on).   Breakage of the amniotic sac.  RISK FACTORS  Having a previous history of preterm labor.   Having premature rupture of membranes (PROM).   Having a placenta that covers the opening of the cervix (placenta previa).   Having a placenta that separates from the uterus (placental abruption).   Having a cervix that is too weak to hold the fetus in the uterus (incompetent cervix).   Having too much fluid in the amniotic sac (polyhydramnios).   Taking illegal drugs or smoking while pregnant.   Not gaining enough weight while pregnant.   Being younger than 18 and older than 26 years old.   Having a low socioeconomic status.   Being African American. SYMPTOMS Signs and symptoms of preterm labor include:   Menstrual-like cramps, abdominal pain, or back pain.  Uterine contractions that are regular, as frequent as six in an hour, regardless of their intensity (may be mild or painful).  Contractions that start on the top of the uterus and spread down to the lower abdomen and back.   A sense of increased pelvic pressure.   A watery or bloody mucus discharge that  comes from the vagina.  TREATMENT Depending on the length of the pregnancy and other circumstances, your health care provider may suggest bed rest. If necessary, there are medicines that can be given to stop contractions and to mature the fetal lungs. If labor happens before 34 weeks of pregnancy, a prolonged hospital stay may be recommended. Treatment depends on the condition of both you and the fetus.  WHAT SHOULD YOU DO IF YOU THINK YOU ARE IN PRETERM LABOR? Call your health care provider right away. You will need to go to the hospital to get checked immediately. HOW CAN YOU PREVENT PRETERM LABOR IN FUTURE PREGNANCIES? You should:   Stop smoking if you smoke.  Maintain healthy weight gain and avoid chemicals and drugs that are not necessary.  Be watchful for any type of infection.  Inform your health care provider if you have a known history of preterm labor.   This information is not intended to replace advice given to you by your health care provider. Make sure you discuss any questions you have with your health care provider.   Document Released: 07/06/2003 Document Revised: 12/16/2012 Document Reviewed: 05/18/2012 Elsevier Interactive Patient Education 2016 [employer]  Tdap Vaccine (Tetanus, Diphtheria and Pertussis): What You Need to Know 1. Why get vaccinated? Tetanus, diphtheria and pertussis are very serious diseases. Tdap vaccine can protect us from these diseases. And, Tdap vaccine given to pregnant women can protect newborn babies against pertussis. TETANUS (Lockjaw) is rare in the [place] today. It causes painful muscle tightening and   stiffness, usually all over the body.  It can lead to tightening of muscles in the head and neck so you can't open your mouth, swallow, or sometimes even breathe. Tetanus kills about 1 out of 10 people who are infected even after receiving the best medical care. DIPHTHERIA is also rare in the [place] today. It can cause a  thick coating to form in the back of the throat.  It can lead to breathing problems, heart failure, paralysis, and death. PERTUSSIS (Whooping Cough) causes severe coughing spells, which can cause difficulty breathing, vomiting and disturbed sleep.  It can also lead to weight loss, incontinence, and rib fractures. Up to 2 in 100 adolescents and 5 in 100 adults with pertussis are hospitalized or have complications, which could include pneumonia or death. These diseases are caused by bacteria. Diphtheria and pertussis are spread from person to person through secretions from coughing or sneezing. Tetanus enters the body through cuts, scratches, or wounds. Before vaccines, as many as 200,000 cases of diphtheria, 200,000 cases of pertussis, and hundreds of cases of tetanus, were reported in the [place] each year. Since vaccination began, reports of cases for tetanus and diphtheria have dropped by about 99% and for pertussis by about 80%. 2. Tdap vaccine Tdap vaccine can protect adolescents and adults from tetanus, diphtheria, and pertussis. One dose of Tdap is routinely given at age 11 or 12. People who did not get Tdap at that age should get it as soon as possible. Tdap is especially important for healthcare professionals and anyone having close contact with a baby younger than 12 months. Pregnant women should get a dose of Tdap during every pregnancy, to protect the newborn from pertussis. Infants are most at risk for severe, life-threatening complications from pertussis. Another vaccine, called Td, protects against tetanus and diphtheria, but not pertussis. A Td booster should be given every 10 years. Tdap may be given as one of these boosters if you have never gotten Tdap before. Tdap may also be given after a severe cut or burn to prevent tetanus infection. Your doctor or the person giving you the vaccine can give you more information. Tdap may safely be given at the same time as other  vaccines. 3. Some people should not get this vaccine  A person who has ever had a life-threatening allergic reaction after a previous dose of any diphtheria, tetanus or pertussis containing vaccine, OR has a severe allergy to any part of this vaccine, should not get Tdap vaccine. Tell the person giving the vaccine about any severe allergies.  Anyone who had coma or long repeated seizures within 7 days after a childhood dose of DTP or DTaP, or a previous dose of Tdap, should not get Tdap, unless a cause other than the vaccine was found. They can still get Td.  Talk to your doctor if you:  have seizures or another nervous system problem,  had severe pain or swelling after any vaccine containing diphtheria, tetanus or pertussis,  ever had a condition called Guillain-Barr Syndrome (GBS),  aren't feeling well on the day the shot is scheduled. 4. Risks With any medicine, including vaccines, there is a chance of side effects. These are usually mild and go away on their own. Serious reactions are also possible but are rare. Most people who get Tdap vaccine do not have any problems with it. Mild problems following Tdap (Did not interfere with activities)  Pain where the shot was given (about 3 in 4 adolescents or   2 in 3 adults)  Redness or swelling where the shot was given (about 1 person in 5)  Mild fever of at least 100.4F (up to about 1 in 25 adolescents or 1 in 100 adults)  Headache (about 3 or 4 people in 10)  Tiredness (about 1 person in 3 or 4)  Nausea, vomiting, diarrhea, stomach ache (up to 1 in 4 adolescents or 1 in 10 adults)  Chills, sore joints (about 1 person in 10)  Body aches (about 1 person in 3 or 4)  Rash, swollen glands (uncommon) Moderate problems following Tdap (Interfered with activities, but did not require medical attention)  Pain where the shot was given (up to 1 in 5 or 6)  Redness or swelling where the shot was given (up to about 1 in 16 adolescents or  1 in 12 adults)  Fever over 102F (about 1 in 100 adolescents or 1 in 250 adults)  Headache (about 1 in 7 adolescents or 1 in 10 adults)  Nausea, vomiting, diarrhea, stomach ache (up to 1 or 3 people in 100)  Swelling of the entire arm where the shot was given (up to about 1 in 500). Severe problems following Tdap (Unable to perform usual activities; required medical attention)  Swelling, severe pain, bleeding and redness in the arm where the shot was given (rare). Problems that could happen after any vaccine:  People sometimes faint after a medical procedure, including vaccination. Sitting or lying down for about 15 minutes can help prevent fainting, and injuries caused by a fall. Tell your doctor if you feel dizzy, or have vision changes or ringing in the ears.  Some people get severe pain in the shoulder and have difficulty moving the arm where a shot was given. This happens very rarely.  Any medication can cause a severe allergic reaction. Such reactions from a vaccine are very rare, estimated at fewer than 1 in a million doses, and would happen within a few minutes to a few hours after the vaccination. As with any medicine, there is a very remote chance of a vaccine causing a serious injury or death. The safety of vaccines is always being monitored. For more information, visit: www.cdc.gov/vaccinesafety/ 5. What if there is a serious problem? What should I look for?  Look for anything that concerns you, such as signs of a severe allergic reaction, very high fever, or unusual behavior.  Signs of a severe allergic reaction can include hives, swelling of the face and throat, difficulty breathing, a fast heartbeat, dizziness, and weakness. These would usually start a few minutes to a few hours after the vaccination. What should I do?  If you think it is a severe allergic reaction or other emergency that can't wait, call 9-1-1 or get the person to the nearest hospital. Otherwise, call  your doctor.  Afterward, the reaction should be reported to the Vaccine Adverse Event Reporting System (VAERS). Your doctor might file this report, or you can do it yourself through the VAERS web site at www.vaers.hhs.gov, or by calling 1-800-822-7967. VAERS does not give medical advice.  6. The National Vaccine Injury Compensation Program The National Vaccine Injury Compensation Program (VICP) is a federal program that was created to compensate people who may have been injured by certain vaccines. Persons who believe they may have been injured by a vaccine can learn about the program and about filing a claim by calling 1-800-338-2382 or visiting the VICP website at www.hrsa.gov/vaccinecompensation. There is a time limit to file a   claim for compensation. 7. How can I learn more?  Ask your doctor. He or she can give you the vaccine package insert or suggest other sources of information.  Call your local or state health department.  Contact the Centers for Disease Control and Prevention (CDC):  Call 1-800-232-4636 (1-800-CDC-INFO) or  Visit CDC's website at www.cdc.gov/vaccines CDC Tdap Vaccine VIS (06/22/13)   This information is not intended to replace advice given to you by your health care provider. Make sure you discuss any questions you have with your health care provider.   Document Released: 10/15/2011 Document Revised: 05/06/2014 Document Reviewed: 07/28/2013 Elsevier Interactive Patient Education 2016 Else[employer] Inc.  

## 2015-09-27 ENCOUNTER — Ambulatory Visit (INDEPENDENT_AMBULATORY_CARE_PROVIDER_SITE_OTHER): Payer: Managed Care, Other (non HMO) | Admitting: Medical

## 2015-09-27 VITALS — BP 112/59 | HR 71 | Wt 183.8 lb

## 2015-09-27 DIAGNOSIS — Z3482 Encounter for supervision of other normal pregnancy, second trimester: Secondary | ICD-10-CM | POA: Diagnosis not present

## 2015-09-27 DIAGNOSIS — Z23 Encounter for immunization: Secondary | ICD-10-CM | POA: Diagnosis not present

## 2015-09-27 LAB — CBC
HEMATOCRIT: 33.4 % — AB (ref 35.0–45.0)
HEMOGLOBIN: 10.9 g/dL — AB (ref 11.7–15.5)
MCH: 27.3 pg (ref 27.0–33.0)
MCHC: 32.6 g/dL (ref 32.0–36.0)
MCV: 83.5 fL (ref 80.0–100.0)
MPV: 9.2 fL (ref 7.5–12.5)
Platelets: 268 10*3/uL (ref 140–400)
RBC: 4 MIL/uL (ref 3.80–5.10)
RDW: 13.8 % (ref 11.0–15.0)
WBC: 10.3 10*3/uL (ref 3.8–10.8)

## 2015-09-27 LAB — POCT URINALYSIS DIP (DEVICE)
BILIRUBIN URINE: NEGATIVE
Glucose, UA: NEGATIVE mg/dL
Hgb urine dipstick: NEGATIVE
KETONES UR: NEGATIVE mg/dL
Leukocytes, UA: NEGATIVE
Nitrite: NEGATIVE
PH: 5.5 (ref 5.0–8.0)
Protein, ur: NEGATIVE mg/dL
Specific Gravity, Urine: 1.025 (ref 1.005–1.030)
Urobilinogen, UA: 0.2 mg/dL (ref 0.0–1.0)

## 2015-09-27 MED ORDER — TETANUS-DIPHTH-ACELL PERTUSSIS 5-2.5-18.5 LF-MCG/0.5 IM SUSP
0.5000 mL | Freq: Once | INTRAMUSCULAR | Status: AC
  Administered 2015-09-27: 0.5 mL via INTRAMUSCULAR

## 2015-09-27 NOTE — Patient Instructions (Addendum)
Third Trimester of Pregnancy °The third trimester is from week 29 through week 42, months 7 through 9. This trimester is when your unborn baby (fetus) is growing very fast. At the end of the ninth month, the unborn baby is about 20 inches in length. It weighs about 6-10 pounds.  °HOME CARE  °· Avoid all smoking, herbs, and alcohol. Avoid drugs not approved by your doctor. °· Do not use any tobacco products, including cigarettes, chewing tobacco, and electronic cigarettes. If you need help quitting, ask your doctor. You may get counseling or other support to help you quit. °· Only take medicine as told by your doctor. Some medicines are safe and some are not during pregnancy. °· Exercise only as told by your doctor. Stop exercising if you start having cramps. °· Eat regular, healthy meals. °· Wear a good support bra if your breasts are tender. °· Do not use hot tubs, steam rooms, or saunas. °· Wear your seat belt when driving. °· Avoid raw meat, uncooked cheese, and liter boxes and soil used by cats. °· Take your prenatal vitamins. °· Take 1500-2000 milligrams of calcium daily starting at the 20th week of pregnancy until you deliver your baby. °· Try taking medicine that helps you poop (stool softener) as needed, and if your doctor approves. Eat more fiber by eating fresh fruit, vegetables, and whole grains. Drink enough fluids to keep your pee (urine) clear or pale yellow. °· Take warm water baths (sitz baths) to soothe pain or discomfort caused by hemorrhoids. Use hemorrhoid cream if your doctor approves. °· If you have puffy, bulging veins (varicose veins), wear support hose. Raise (elevate) your feet for 15 minutes, 3-4 times a day. Limit salt in your diet. °· Avoid heavy lifting, wear low heels, and sit up straight. °· Rest with your legs raised if you have leg cramps or low back pain. °· Visit your dentist if you have not gone during your pregnancy. Use a soft toothbrush to brush your teeth. Be gentle when you  floss. °· You can have sex (intercourse) unless your doctor tells you not to. °· Do not travel far distances unless you must. Only do so with your doctor's approval. °· Take prenatal classes. °· Practice driving to the hospital. °· Pack your hospital bag. °· Prepare the baby's room. °· Go to your doctor visits. °GET HELP IF: °1. You are not sure if you are in labor or if your water has broken. °2. You are dizzy. °3. You have mild cramps or pressure in your lower belly (abdominal). °4. You have a nagging pain in your belly area. °5. You continue to feel sick to your stomach (nauseous), throw up (vomit), or have watery poop (diarrhea). °6. You have bad smelling fluid coming from your vagina. °7. You have pain with peeing (urination). °GET HELP RIGHT AWAY IF:  °· You have a fever. °· You are leaking fluid from your vagina. °· You are spotting or bleeding from your vagina. °· You have severe belly cramping or pain. °· You lose or gain weight rapidly. °· You have trouble catching your breath and have chest pain. °· You notice sudden or extreme puffiness (swelling) of your face, hands, ankles, feet, or legs. °· You have not felt the baby move in over an hour. °· You have severe headaches that do not go away with medicine. °· You have vision changes. °  °This information is not intended to replace advice given to you by your health care   provider. Make sure you discuss any questions you have with your health care provider. °  °Document Released: 07/10/2009 Document Revised: 05/06/2014 Document Reviewed: 06/16/2012 °Elsevier Interactive Patient Education ©2016 [employer] ° °Fetal Movement Counts °Patient Name: __________________________________________________ Patient Due Date: ____________________ °Performing a fetal movement count is highly recommended in high-risk pregnancies, but it is good for every pregnant woman to do. Your health care provider may ask you to start counting fetal movements at 28 weeks of the  pregnancy. Fetal movements often increase: °· After eating a full meal. °· After physical activity. °· After eating or drinking something sweet or cold. °· At rest. °Pay attention to when you feel the baby is most active. This will help you notice a pattern of your baby's sleep and wake cycles and what factors contribute to an increase in fetal movement. It is important to perform a fetal movement count at the same time each day when your baby is normally most active.  °HOW TO COUNT FETAL MOVEMENTS °8. Find a quiet and comfortable area to sit or lie down on your left side. Lying on your left side provides the best blood and oxygen circulation to your baby. °9. Write down the day and time on a sheet of paper or in a journal. °10. Start counting kicks, flutters, swishes, rolls, or jabs in a 2-hour period. You should feel at least 10 movements within 2 hours. °11. If you do not feel 10 movements in 2 hours, wait 2-3 hours and count again. Look for a change in the pattern or not enough counts in 2 hours. °SEEK MEDICAL CARE IF: °· You feel less than 10 counts in 2 hours, tried twice. °· There is no movement in over an hour. °· The pattern is changing or taking longer each day to reach 10 counts in 2 hours. °· You feel the baby is not moving as he or she usually does. °Date: ____________ Movements: ____________ Start time: ____________ Finish time: ____________  °Date: ____________ Movements: ____________ Start time: ____________ Finish time: ____________ °Date: ____________ Movements: ____________ Start time: ____________ Finish time: ____________ °Date: ____________ Movements: ____________ Start time: ____________ Finish time: ____________ °Date: ____________ Movements: ____________ Start time: ____________ Finish time: ____________ °Date: ____________ Movements: ____________ Start time: ____________ Finish time: ____________ °Date: ____________ Movements: ____________ Start time: ____________ Finish time:  ____________ °Date: ____________ Movements: ____________ Start time: ____________ Finish time: ____________  °Date: ____________ Movements: ____________ Start time: ____________ Finish time: ____________ °Date: ____________ Movements: ____________ Start time: ____________ Finish time: ____________ °Date: ____________ Movements: ____________ Start time: ____________ Finish time: ____________ °Date: ____________ Movements: ____________ Start time: ____________ Finish time: ____________ °Date: ____________ Movements: ____________ Start time: ____________ Finish time: ____________ °Date: ____________ Movements: ____________ Start time: ____________ Finish time: ____________ °Date: ____________ Movements: ____________ Start time: ____________ Finish time: ____________  °Date: ____________ Movements: ____________ Start time: ____________ Finish time: ____________ °Date: ____________ Movements: ____________ Start time: ____________ Finish time: ____________ °Date: ____________ Movements: ____________ Start time: ____________ Finish time: ____________ °Date: ____________ Movements: ____________ Start time: ____________ Finish time: ____________ °Date: ____________ Movements: ____________ Start time: ____________ Finish time: ____________ °Date: ____________ Movements: ____________ Start time: ____________ Finish time: ____________ °Date: ____________ Movements: ____________ Start time: ____________ Finish time: ____________  °Date: ____________ Movements: ____________ Start time: ____________ Finish time: ____________ °Date: ____________ Movements: ____________ Start time: ____________ Finish time: ____________ °Date: ____________ Movements: ____________ Start time: ____________ Finish time: ____________ °Date: ____________ Movements: ____________ Start time: ____________ Finish time: ____________ °Date: ____________ Movements: ____________ Start time:   ____________ Finish time: ____________ °Date: ____________ Movements:  ____________ Start time: ____________ Finish time: ____________ °Date: ____________ Movements: ____________ Start time: ____________ Finish time: ____________  °Date: ____________ Movements: ____________ Start time: ____________ Finish time: ____________ °Date: ____________ Movements: ____________ Start time: ____________ Finish time: ____________ °Date: ____________ Movements: ____________ Start time: ____________ Finish time: ____________ °Date: ____________ Movements: ____________ Start time: ____________ Finish time: ____________ °Date: ____________ Movements: ____________ Start time: ____________ Finish time: ____________ °Date: ____________ Movements: ____________ Start time: ____________ Finish time: ____________ °Date: ____________ Movements: ____________ Start time: ____________ Finish time: ____________  °Date: ____________ Movements: ____________ Start time: ____________ Finish time: ____________ °Date: ____________ Movements: ____________ Start time: ____________ Finish time: ____________ °Date: ____________ Movements: ____________ Start time: ____________ Finish time: ____________ °Date: ____________ Movements: ____________ Start time: ____________ Finish time: ____________ °Date: ____________ Movements: ____________ Start time: ____________ Finish time: ____________ °Date: ____________ Movements: ____________ Start time: ____________ Finish time: ____________ °Date: ____________ Movements: ____________ Start time: ____________ Finish time: ____________  °Date: ____________ Movements: ____________ Start time: ____________ Finish time: ____________ °Date: ____________ Movements: ____________ Start time: ____________ Finish time: ____________ °Date: ____________ Movements: ____________ Start time: ____________ Finish time: ____________ °Date: ____________ Movements: ____________ Start time: ____________ Finish time: ____________ °Date: ____________ Movements: ____________ Start time: ____________ Finish  time: ____________ °Date: ____________ Movements: ____________ Start time: ____________ Finish time: ____________ °Date: ____________ Movements: ____________ Start time: ____________ Finish time: ____________  °Date: ____________ Movements: ____________ Start time: ____________ Finish time: ____________ °Date: ____________ Movements: ____________ Start time: ____________ Finish time: ____________ °Date: ____________ Movements: ____________ Start time: ____________ Finish time: ____________ °Date: ____________ Movements: ____________ Start time: ____________ Finish time: ____________ °Date: ____________ Movements: ____________ Start time: ____________ Finish time: ____________ °Date: ____________ Movements: ____________ Start time: ____________ Finish time: ____________ °  °This information is not intended to replace advice given to you by your health care provider. Make sure you discuss any questions you have with your health care provider. °  °Document Released: 05/15/2006 Document Revised: 05/06/2014 Document Reviewed: 02/10/2012 °Elsevier Interactive Patient Education ©2016 [employer] ° °

## 2015-09-27 NOTE — Progress Notes (Signed)
Subjective:  Lori HarrowSabrina Payne is a 25 y.o. G2P1001 at 6078w3d being seen today for ongoing prenatal care.  She is currently monitored for the following issues for this low-risk pregnancy and has Headache and Supervision of normal pregnancy, antepartum on her problem list.  Patient reports no complaints.  Contractions: Not present. Vag. Bleeding: None.  Movement: Present. Denies leaking of fluid.   The following portions of the patient's history were reviewed and updated as appropriate: allergies, current medications, past family history, past medical history, past social history, past surgical history and problem list. Problem list updated.  Objective:   Filed Vitals:   09/27/15 0747  BP: 112/59  Pulse: 71  Weight: 183 lb 12.8 oz (83.371 kg)    Fetal Status: Fetal Heart Rate (bpm): 130 Fundal Height: 28 cm Movement: Present     General:  Alert, oriented and cooperative. Patient is in no acute distress.  Skin: Skin is warm and dry. No rash noted.   Cardiovascular: Normal heart rate noted  Respiratory: Normal respiratory effort, no problems with respiration noted  Abdomen: Soft, gravid, appropriate for gestational age. Pain/Pressure: Present     Pelvic: Vag. Bleeding: None     Cervical exam deferred        Extremities: Normal range of motion.  Edema: None  Mental Status: Normal mood and affect. Normal behavior. Normal judgment and thought content.   Urinalysis:      Assessment and Plan:  Pregnancy: G2P1001 at 5978w3d  1. Supervision of normal pregnancy, antepartum, second trimester - Tdap (BOOSTRIX) injection 0.5 mL; Inject 0.5 mLs into the muscle once. - Glucose Tolerance, 1 HR (50g) w/o Fasting - CBC - HIV antibody (with reflex) - RPR  Preterm labor symptoms and general obstetric precautions including but not limited to vaginal bleeding, contractions, leaking of fluid and fetal movement were reviewed in detail with the patient. Please refer to After Visit Summary for other  counseling recommendations.  Return in about 2 weeks (around 10/11/2015) for ROB.   Marny LowensteinJulie N Mike Hamre, PA-C

## 2015-09-27 NOTE — Progress Notes (Signed)
Breastfeeding tip reviewed 28 labs today/Tdap

## 2015-09-28 LAB — RPR

## 2015-09-28 LAB — GLUCOSE TOLERANCE, 1 HOUR (50G) W/O FASTING: Glucose, 1 Hr, gestational: 72 mg/dL (ref <140)

## 2015-09-28 LAB — HIV ANTIBODY (ROUTINE TESTING W REFLEX): HIV 1&2 Ab, 4th Generation: NONREACTIVE

## 2015-10-11 ENCOUNTER — Ambulatory Visit (INDEPENDENT_AMBULATORY_CARE_PROVIDER_SITE_OTHER): Payer: Managed Care, Other (non HMO) | Admitting: Family Medicine

## 2015-10-11 VITALS — BP 99/64 | HR 72 | Wt 187.8 lb

## 2015-10-11 DIAGNOSIS — Z3483 Encounter for supervision of other normal pregnancy, third trimester: Secondary | ICD-10-CM

## 2015-10-11 LAB — POCT URINALYSIS DIP (DEVICE)
BILIRUBIN URINE: NEGATIVE
Glucose, UA: NEGATIVE mg/dL
HGB URINE DIPSTICK: NEGATIVE
KETONES UR: NEGATIVE mg/dL
Leukocytes, UA: NEGATIVE
Nitrite: NEGATIVE
PH: 5.5 (ref 5.0–8.0)
PROTEIN: NEGATIVE mg/dL
Specific Gravity, Urine: 1.025 (ref 1.005–1.030)
Urobilinogen, UA: 0.2 mg/dL (ref 0.0–1.0)

## 2015-10-11 NOTE — Patient Instructions (Signed)

## 2015-10-11 NOTE — Progress Notes (Signed)
Breastfeeding tip of the week reviewed. 

## 2015-10-11 NOTE — Progress Notes (Signed)
Subjective:  Lori HarrowSabrina Payne is a 25 y.o. G2P1001 at 6732w3d being seen today for ongoing prenatal care.  She is currently monitored for the following issues for this low-risk pregnancy and has Headache and Supervision of normal pregnancy, antepartum on her problem list.  Patient reports no complaints.  Contractions: Not present. Vag. Bleeding: None.  Movement: Present. Denies leaking of fluid.   The following portions of the patient's history were reviewed and updated as appropriate: allergies, current medications, past family history, past medical history, past social history, past surgical history and problem list. Problem list updated.  Objective:   Filed Vitals:   10/11/15 0809  BP: 99/64  Pulse: 72  Weight: 187 lb 12.8 oz (85.186 kg)    Fetal Status: Fetal Heart Rate (bpm): 148   Movement: Present     General:  Alert, oriented and cooperative. Patient is in no acute distress.  Skin: Skin is warm and dry. No rash noted.   Cardiovascular: Normal heart rate noted  Respiratory: Normal respiratory effort, no problems with respiration noted  Abdomen: Soft, gravid, appropriate for gestational age. Pain/Pressure: Absent     Pelvic: Cervical exam deferred        Extremities: Normal range of motion.  Edema: None  Mental Status: Normal mood and affect. Normal behavior. Normal judgment and thought content.   Urinalysis:      Assessment and Plan:  Pregnancy: G2P1001 at 8432w3d  1. Supervision of normal pregnancy, antepartum, third trimester discussed contraception and interested in Nexplanon Updated box  Preterm labor symptoms and general obstetric precautions including but not limited to vaginal bleeding, contractions, leaking of fluid and fetal movement were reviewed in detail with the patient. Please refer to After Visit Summary for other counseling recommendations.  Return in about 2 weeks (around 10/25/2015) for Routine prenatal care.   Federico FlakeKimberly Niles Lia Vigilante, MD

## 2015-10-25 ENCOUNTER — Ambulatory Visit (INDEPENDENT_AMBULATORY_CARE_PROVIDER_SITE_OTHER): Payer: Managed Care, Other (non HMO) | Admitting: Certified Nurse Midwife

## 2015-10-25 VITALS — BP 117/88 | HR 72 | Wt 193.4 lb

## 2015-10-25 DIAGNOSIS — Z3483 Encounter for supervision of other normal pregnancy, third trimester: Secondary | ICD-10-CM | POA: Diagnosis not present

## 2015-10-25 DIAGNOSIS — G44209 Tension-type headache, unspecified, not intractable: Secondary | ICD-10-CM

## 2015-10-25 LAB — POCT URINALYSIS DIP (DEVICE)
Bilirubin Urine: NEGATIVE
Glucose, UA: NEGATIVE mg/dL
Hgb urine dipstick: NEGATIVE
Ketones, ur: NEGATIVE mg/dL
Nitrite: NEGATIVE
PH: 6 (ref 5.0–8.0)
PROTEIN: NEGATIVE mg/dL
Urobilinogen, UA: 1 mg/dL (ref 0.0–1.0)

## 2015-10-25 NOTE — Progress Notes (Signed)
Subjective:  Michel HarrowSabrina Duch is a 25 y.o. G2P1001 at 714w3d being seen today for ongoing prenatal care.  She is currently monitored for the following issues for this low-risk pregnancy and has Headache and Supervision of normal pregnancy, antepartum on her problem list.  Patient reports contractions since last visit, multiple per day, max 3 per hr, duration 30 sec and headache, she reports improved and responds well to Tylenol.  Contractions: Not present. Vag. Bleeding: None.  Movement: Present. Denies leaking of fluid.   The following portions of the patient's history were reviewed and updated as appropriate: allergies, current medications, past family history, past medical history, past social history, past surgical history and problem list. Problem list updated.  Objective:   Filed Vitals:   10/25/15 0804  BP: 117/88  Pulse: 72  Weight: 193 lb 6.4 oz (87.726 kg)    Fetal Status: Fetal Heart Rate (bpm): 138 Fundal Height: 32 cm Movement: Present  Presentation: Vertex  General:  Alert, oriented and cooperative. Patient is in no acute distress.  Skin: Skin is warm and dry. No rash noted.   Cardiovascular: Normal heart rate noted  Respiratory: Normal respiratory effort, no problems with respiration noted  Abdomen: Soft, gravid, appropriate for gestational age. Pain/Pressure: Present     Pelvic: Vag. Bleeding: None     Cervical exam performed        Extremities: Normal range of motion.  Edema: None  Mental Status: Normal mood and affect. Normal behavior. Normal judgment and thought content.   Urinalysis:      Assessment and Plan:  Pregnancy: G2P1001 at 344w3d  1. Supervision of normal pregnancy, antepartum, third trimester -desires waterbirth, discussed class offerings, will plan to attend in July -discussed weight gain goals, recommend decrease carbs and sugars, increase protein  2. Tension-type headache, not intractable, unspecified chronicity pattern -improved, Tylenol prn  3.  Braxton-Hicks contractions -increase water intake -warm bath for comfort  Preterm labor symptoms and general obstetric precautions including but not limited to vaginal bleeding, contractions, leaking of fluid and fetal movement were reviewed in detail with the patient. Please refer to After Visit Summary for other counseling recommendations.  Return in about 2 weeks (around 11/08/2015).   Donette LarryMelanie Machele Deihl, CNM

## 2015-11-13 ENCOUNTER — Encounter: Payer: Self-pay | Admitting: Obstetrics and Gynecology

## 2015-11-13 ENCOUNTER — Ambulatory Visit (INDEPENDENT_AMBULATORY_CARE_PROVIDER_SITE_OTHER): Payer: Managed Care, Other (non HMO) | Admitting: Obstetrics and Gynecology

## 2015-11-13 VITALS — BP 117/64 | HR 84 | Wt 195.9 lb

## 2015-11-13 DIAGNOSIS — Z3483 Encounter for supervision of other normal pregnancy, third trimester: Secondary | ICD-10-CM

## 2015-11-13 DIAGNOSIS — Z113 Encounter for screening for infections with a predominantly sexual mode of transmission: Secondary | ICD-10-CM | POA: Diagnosis not present

## 2015-11-13 DIAGNOSIS — Z3480 Encounter for supervision of other normal pregnancy, unspecified trimester: Secondary | ICD-10-CM

## 2015-11-13 LAB — POCT URINALYSIS DIP (DEVICE)
BILIRUBIN URINE: NEGATIVE
Glucose, UA: NEGATIVE mg/dL
KETONES UR: NEGATIVE mg/dL
Leukocytes, UA: NEGATIVE
NITRITE: NEGATIVE
PH: 7 (ref 5.0–8.0)
PROTEIN: NEGATIVE mg/dL
Specific Gravity, Urine: 1.015 (ref 1.005–1.030)
Urobilinogen, UA: 1 mg/dL (ref 0.0–1.0)

## 2015-11-13 LAB — OB RESULTS CONSOLE GBS: STREP GROUP B AG: NEGATIVE

## 2015-11-13 NOTE — Patient Instructions (Signed)
Breastfeeding Deciding to breastfeed is one of the best choices you can make for you and your baby. A change in hormones during pregnancy causes your breast tissue to grow and increases the number and size of your milk ducts. These hormones also allow proteins, sugars, and fats from your blood supply to make breast milk in your milk-producing glands. Hormones prevent breast milk from being released before your baby is born as well as prompt milk flow after birth. Once breastfeeding has begun, thoughts of your baby, as well as his or her sucking or crying, can stimulate the release of milk from your milk-producing glands.  BENEFITS OF BREASTFEEDING For Your Baby  Your first milk (colostrum) helps your baby's digestive system function better.  There are antibodies in your milk that help your baby fight off infections.  Your baby has a lower incidence of asthma, allergies, and sudden infant death syndrome.  The nutrients in breast milk are better for your baby than infant formulas and are designed uniquely for your baby's needs.  Breast milk improves your baby's brain development.  Your baby is less likely to develop other conditions, such as childhood obesity, asthma, or type 2 diabetes mellitus. For You  Breastfeeding helps to create a very special bond between you and your baby.  Breastfeeding is convenient. Breast milk is always available at the correct temperature and costs nothing.  Breastfeeding helps to burn calories and helps you lose the weight gained during pregnancy.  Breastfeeding makes your uterus contract to its prepregnancy size faster and slows bleeding (lochia) after you give birth.   Breastfeeding helps to lower your risk of developing type 2 diabetes mellitus, osteoporosis, and breast or ovarian cancer later in life. SIGNS THAT YOUR BABY IS HUNGRY Early Signs of Hunger  Increased alertness or activity.  Stretching.  Movement of the head from side to  side.  Movement of the head and opening of the mouth when the corner of the mouth or cheek is stroked (rooting).  Increased sucking sounds, smacking lips, cooing, sighing, or squeaking.  Hand-to-mouth movements.  Increased sucking of fingers or hands. Late Signs of Hunger  Fussing.  Intermittent crying. Extreme Signs of Hunger Signs of extreme hunger will require calming and consoling before your baby will be able to breastfeed successfully. Do not wait for the following signs of extreme hunger to occur before you initiate breastfeeding:  Restlessness.  A loud, strong cry.  Screaming. BREASTFEEDING BASICS Breastfeeding Initiation  Find a comfortable place to sit or lie down, with your neck and back well supported.  Place a pillow or rolled up blanket under your baby to bring him or her to the level of your breast (if you are seated). Nursing pillows are specially designed to help support your arms and your baby while you breastfeed.  Make sure that your baby's abdomen is facing your abdomen.  Gently massage your breast. With your fingertips, massage from your chest wall toward your nipple in a circular motion. This encourages milk flow. You may need to continue this action during the feeding if your milk flows slowly.  Support your breast with 4 fingers underneath and your thumb above your nipple. Make sure your fingers are well away from your nipple and your baby's mouth.  Stroke your baby's lips gently with your finger or nipple.  When your baby's mouth is open wide enough, quickly bring your baby to your breast, placing your entire nipple and as much of the colored area around your nipple (  areola) as possible into your baby's mouth.  More areola should be visible above your baby's upper lip than below the lower lip.  Your baby's tongue should be between his or her lower gum and your breast.  Ensure that your baby's mouth is correctly positioned around your nipple  (latched). Your baby's lips should create a seal on your breast and be turned out (everted).  It is common for your baby to suck about 2-3 minutes in order to start the flow of breast milk. Latching Teaching your baby how to latch on to your breast properly is very important. An improper latch can cause nipple pain and decreased milk supply for you and poor weight gain in your baby. Also, if your baby is not latched onto your nipple properly, he or she may swallow some air during feeding. This can make your baby fussy. Burping your baby when you switch breasts during the feeding can help to get rid of the air. However, teaching your baby to latch on properly is still the best way to prevent fussiness from swallowing air while breastfeeding. Signs that your baby has successfully latched on to your nipple:  Silent tugging or silent sucking, without causing you pain.  Swallowing heard between every 3-4 sucks.  Muscle movement above and in front of his or her ears while sucking. Signs that your baby has not successfully latched on to nipple:  Sucking sounds or smacking sounds from your baby while breastfeeding.  Nipple pain. If you think your baby has not latched on correctly, slip your finger into the corner of your baby's mouth to break the suction and place it between your baby's gums. Attempt breastfeeding initiation again. Signs of Successful Breastfeeding Signs from your baby:  A gradual decrease in the number of sucks or complete cessation of sucking.  Falling asleep.  Relaxation of his or her body.  Retention of a small amount of milk in his or her mouth.  Letting go of your breast by himself or herself. Signs from you:  Breasts that have increased in firmness, weight, and size 1-3 hours after feeding.  Breasts that are softer immediately after breastfeeding.  Increased milk volume, as well as a change in milk consistency and color by the fifth day of breastfeeding.  Nipples  that are not sore, cracked, or bleeding. Signs That Your Baby is Getting Enough Milk  Wetting at least 3 diapers in a 24-hour period. The urine should be clear and pale yellow by age 5 days.  At least 3 stools in a 24-hour period by age 5 days. The stool should be soft and yellow.  At least 3 stools in a 24-hour period by age 7 days. The stool should be seedy and yellow.  No loss of weight greater than 10% of birth weight during the first 3 days of age.  Average weight gain of 4-7 ounces (113-198 g) per week after age 4 days.  Consistent daily weight gain by age 5 days, without weight loss after the age of 2 weeks. After a feeding, your baby may spit up a small amount. This is common. BREASTFEEDING FREQUENCY AND DURATION Frequent feeding will help you make more milk and can prevent sore nipples and breast engorgement. Breastfeed when you feel the need to reduce the fullness of your breasts or when your baby shows signs of hunger. This is called "breastfeeding on demand." Avoid introducing a pacifier to your baby while you are working to establish breastfeeding (the first 4-6 weeks   after your baby is born). After this time you may choose to use a pacifier. Research has shown that pacifier use during the first year of a baby's life decreases the risk of sudden infant death syndrome (SIDS). Allow your baby to feed on each breast as long as he or she wants. Breastfeed until your baby is finished feeding. When your baby unlatches or falls asleep while feeding from the first breast, offer the second breast. Because newborns are often sleepy in the first few weeks of life, you may need to awaken your baby to get him or her to feed. Breastfeeding times will vary from baby to baby. However, the following rules can serve as a guide to help you ensure that your baby is properly fed:  Newborns (babies 4 weeks of age or younger) may breastfeed every 1-3 hours.  Newborns should not go longer than 3 hours  during the day or 5 hours during the night without breastfeeding.  You should breastfeed your baby a minimum of 8 times in a 24-hour period until you begin to introduce solid foods to your baby at around 6 months of age. BREAST MILK PUMPING Pumping and storing breast milk allows you to ensure that your baby is exclusively fed your breast milk, even at times when you are unable to breastfeed. This is especially important if you are going back to work while you are still breastfeeding or when you are not able to be present during feedings. Your lactation consultant can give you guidelines on how long it is safe to store breast milk. A breast pump is a machine that allows you to pump milk from your breast into a sterile bottle. The pumped breast milk can then be stored in a refrigerator or freezer. Some breast pumps are operated by hand, while others use electricity. Ask your lactation consultant which type will work best for you. Breast pumps can be purchased, but some hospitals and breastfeeding support groups lease breast pumps on a monthly basis. A lactation consultant can teach you how to hand express breast milk, if you prefer not to use a pump. CARING FOR YOUR BREASTS WHILE YOU BREASTFEED Nipples can become dry, cracked, and sore while breastfeeding. The following recommendations can help keep your breasts moisturized and healthy:  Avoid using soap on your nipples.  Wear a supportive bra. Although not required, special nursing bras and tank tops are designed to allow access to your breasts for breastfeeding without taking off your entire bra or top. Avoid wearing underwire-style bras or extremely tight bras.  Air dry your nipples for 3-4minutes after each feeding.  Use only cotton bra pads to absorb leaked breast milk. Leaking of breast milk between feedings is normal.  Use lanolin on your nipples after breastfeeding. Lanolin helps to maintain your skin's normal moisture barrier. If you use  pure lanolin, you do not need to wash it off before feeding your baby again. Pure lanolin is not toxic to your baby. You may also hand express a few drops of breast milk and gently massage that milk into your nipples and allow the milk to air dry. In the first few weeks after giving birth, some women experience extremely full breasts (engorgement). Engorgement can make your breasts feel heavy, warm, and tender to the touch. Engorgement peaks within 3-5 days after you give birth. The following recommendations can help ease engorgement:  Completely empty your breasts while breastfeeding or pumping. You may want to start by applying warm, moist heat (in   the shower or with warm water-soaked hand towels) just before feeding or pumping. This increases circulation and helps the milk flow. If your baby does not completely empty your breasts while breastfeeding, pump any extra milk after he or she is finished.  Wear a snug bra (nursing or regular) or tank top for 1-2 days to signal your body to slightly decrease milk production.  Apply ice packs to your breasts, unless this is too uncomfortable for you.  Make sure that your baby is latched on and positioned properly while breastfeeding. If engorgement persists after 48 hours of following these recommendations, contact your health care provider or a lactation consultant. OVERALL HEALTH CARE RECOMMENDATIONS WHILE BREASTFEEDING  Eat healthy foods. Alternate between meals and snacks, eating 3 of each per day. Because what you eat affects your breast milk, some of the foods may make your baby more irritable than usual. Avoid eating these foods if you are sure that they are negatively affecting your baby.  Drink milk, fruit juice, and water to satisfy your thirst (about 10 glasses a day).  Rest often, relax, and continue to take your prenatal vitamins to prevent fatigue, stress, and anemia.  Continue breast self-awareness checks.  Avoid chewing and smoking  tobacco. Chemicals from cigarettes that pass into breast milk and exposure to secondhand smoke may harm your baby.  Avoid alcohol and drug use, including marijuana. Some medicines that may be harmful to your baby can pass through breast milk. It is important to ask your health care provider before taking any medicine, including all over-the-counter and prescription medicine as well as vitamin and herbal supplements. It is possible to become pregnant while breastfeeding. If birth control is desired, ask your health care provider about options that will be safe for your baby. SEEK MEDICAL CARE IF:  You feel like you want to stop breastfeeding or have become frustrated with breastfeeding.  You have painful breasts or nipples.  Your nipples are cracked or bleeding.  Your breasts are red, tender, or warm.  You have a swollen area on either breast.  You have a fever or chills.  You have nausea or vomiting.  You have drainage other than breast milk from your nipples.  Your breasts do not become full before feedings by the fifth day after you give birth.  You feel sad and depressed.  Your baby is too sleepy to eat well.  Your baby is having trouble sleeping.   Your baby is wetting less than 3 diapers in a 24-hour period.  Your baby has less than 3 stools in a 24-hour period.  Your baby's skin or the white part of his or her eyes becomes yellow.   Your baby is not gaining weight by 5 days of age. SEEK IMMEDIATE MEDICAL CARE IF:  Your baby is overly tired (lethargic) and does not want to wake up and feed.  Your baby develops an unexplained fever.   This information is not intended to replace advice given to you by your health care provider. Make sure you discuss any questions you have with your health care provider.   Document Released: 04/15/2005 Document Revised: 01/04/2015 Document Reviewed: 10/07/2012 Elsevier Interactive Patient Education 2016 [employer] Third  Trimester of Pregnancy The third trimester is from week 29 through week 42, months 7 through 9. The third trimester is a time when the fetus is growing rapidly. At the end of the ninth month, the fetus is about 20 inches in length and weighs 6-10 pounds.    BODY CHANGES Your body goes through many changes during pregnancy. The changes vary from woman to woman.   Your weight will continue to increase. You can expect to gain 25-35 pounds (11-16 kg) by the end of the pregnancy.  You may begin to get stretch marks on your hips, abdomen, and breasts.  You may urinate more often because the fetus is moving lower into your pelvis and pressing on your bladder.  You may develop or continue to have heartburn as a result of your pregnancy.  You may develop constipation because certain hormones are causing the muscles that push waste through your intestines to slow down.  You may develop hemorrhoids or swollen, bulging veins (varicose veins).  You may have pelvic pain because of the weight gain and pregnancy hormones relaxing your joints between the bones in your pelvis. Backaches may result from overexertion of the muscles supporting your posture.  You may have changes in your hair. These can include thickening of your hair, rapid growth, and changes in texture. Some women also have hair loss during or after pregnancy, or hair that feels dry or thin. Your hair will most likely return to normal after your baby is born.  Your breasts will continue to grow and be tender. A yellow discharge may leak from your breasts called colostrum.  Your belly button may stick out.  You may feel short of breath because of your expanding uterus.  You may notice the fetus "dropping," or moving lower in your abdomen.  You may have a bloody mucus discharge. This usually occurs a few days to a week before labor begins.  Your cervix becomes thin and soft (effaced) near your due date. WHAT TO EXPECT AT YOUR PRENATAL EXAMS   You will have prenatal exams every 2 weeks until week 36. Then, you will have weekly prenatal exams. During a routine prenatal visit:  You will be weighed to make sure you and the fetus are growing normally.  Your blood pressure is taken.  Your abdomen will be measured to track your baby's growth.  The fetal heartbeat will be listened to.  Any test results from the previous visit will be discussed.  You may have a cervical check near your due date to see if you have effaced. At around 36 weeks, your caregiver will check your cervix. At the same time, your caregiver will also perform a test on the secretions of the vaginal tissue. This test is to determine if a type of bacteria, Group B streptococcus, is present. Your caregiver will explain this further. Your caregiver may ask you:  What your birth plan is.  How you are feeling.  If you are feeling the baby move.  If you have had any abnormal symptoms, such as leaking fluid, bleeding, severe headaches, or abdominal cramping.  If you are using any tobacco products, including cigarettes, chewing tobacco, and electronic cigarettes.  If you have any questions. Other tests or screenings that may be performed during your third trimester include:  Blood tests that check for low iron levels (anemia).  Fetal testing to check the health, activity level, and growth of the fetus. Testing is done if you have certain medical conditions or if there are problems during the pregnancy.  HIV (human immunodeficiency virus) testing. If you are at high risk, you may be screened for HIV during your third trimester of pregnancy. FALSE LABOR You may feel small, irregular contractions that eventually go away. These are called Braxton Hicks contractions, or false   labor. Contractions may last for hours, days, or even weeks before true labor sets in. If contractions come at regular intervals, intensify, or become painful, it is best to be seen by your  caregiver.  SIGNS OF LABOR   Menstrual-like cramps.  Contractions that are 5 minutes apart or less.  Contractions that start on the top of the uterus and spread down to the lower abdomen and back.  A sense of increased pelvic pressure or back pain.  A watery or bloody mucus discharge that comes from the vagina. If you have any of these signs before the 37th week of pregnancy, call your caregiver right away. You need to go to the hospital to get checked immediately. HOME CARE INSTRUCTIONS   Avoid all smoking, herbs, alcohol, and unprescribed drugs. These chemicals affect the formation and growth of the baby.  Do not use any tobacco products, including cigarettes, chewing tobacco, and electronic cigarettes. If you need help quitting, ask your health care provider. You may receive counseling support and other resources to help you quit.  Follow your caregiver's instructions regarding medicine use. There are medicines that are either safe or unsafe to take during pregnancy.  Exercise only as directed by your caregiver. Experiencing uterine cramps is a good sign to stop exercising.  Continue to eat regular, healthy meals.  Wear a good support bra for breast tenderness.  Do not use hot tubs, steam rooms, or saunas.  Wear your seat belt at all times when driving.  Avoid raw meat, uncooked cheese, cat litter boxes, and soil used by cats. These carry germs that can cause birth defects in the baby.  Take your prenatal vitamins.  Take 1500-2000 mg of calcium daily starting at the 20th week of pregnancy until you deliver your baby.  Try taking a stool softener (if your caregiver approves) if you develop constipation. Eat more high-fiber foods, such as fresh vegetables or fruit and whole grains. Drink plenty of fluids to keep your urine clear or pale yellow.  Take warm sitz baths to soothe any pain or discomfort caused by hemorrhoids. Use hemorrhoid cream if your caregiver approves.  If  you develop varicose veins, wear support hose. Elevate your feet for 15 minutes, 3-4 times a day. Limit salt in your diet.  Avoid heavy lifting, wear low heal shoes, and practice good posture.  Rest a lot with your legs elevated if you have leg cramps or low back pain.  Visit your dentist if you have not gone during your pregnancy. Use a soft toothbrush to brush your teeth and be gentle when you floss.  A sexual relationship may be continued unless your caregiver directs you otherwise.  Do not travel far distances unless it is absolutely necessary and only with the approval of your caregiver.  Take prenatal classes to understand, practice, and ask questions about the labor and delivery.  Make a trial run to the hospital.  Pack your hospital bag.  Prepare the baby's nursery.  Continue to go to all your prenatal visits as directed by your caregiver. SEEK MEDICAL CARE IF:  You are unsure if you are in labor or if your water has broken.  You have dizziness.  You have mild pelvic cramps, pelvic pressure, or nagging pain in your abdominal area.  You have persistent nausea, vomiting, or diarrhea.  You have a bad smelling vaginal discharge.  You have pain with urination. SEEK IMMEDIATE MEDICAL CARE IF:   You have a fever.  You are leaking fluid   from your vagina.  You have spotting or bleeding from your vagina.  You have severe abdominal cramping or pain.  You have rapid weight loss or gain.  You have shortness of breath with chest pain.  You notice sudden or extreme swelling of your face, hands, ankles, feet, or legs.  You have not felt your baby move in over an hour.  You have severe headaches that do not go away with medicine.  You have vision changes.   This information is not intended to replace advice given to you by your health care provider. Make sure you discuss any questions you have with your health care provider.   Document Released: 04/09/2001 Document  Revised: 05/06/2014 Document Reviewed: 06/16/2012 Elsevier Interactive Patient Education 2016 [employer]  

## 2015-11-13 NOTE — Progress Notes (Signed)
36 wk cultures 

## 2015-11-13 NOTE — Progress Notes (Signed)
Subjective:  Lori Payne is a 25 y.o. G2P1001 at 6620w1d being seen today for ongoing prenatal care.  She is currently monitored for the following issues for this low-risk pregnancy and has Headache and Supervision of normal pregnancy, antepartum on her problem list. Pt was considering a water birth, but no longer wants as they do not do continuous heart rate monitoring.   Patient reports no complaints.  Contractions: Irregular. Vag. Bleeding: None.  Movement: Present. Denies leaking of fluid.   The following portions of the patient's history were reviewed and updated as appropriate: allergies, current medications, past family history, past medical history, past social history, past surgical history and problem list. Problem list updated.  Objective:   Filed Vitals:   11/13/15 1323  BP: 117/64  Pulse: 84  Weight: 195 lb 14.4 oz (88.86 kg)    Fetal Status: Fetal Heart Rate (bpm): 141 Fundal Height: 36 cm Movement: Present  Presentation: Vertex  General:  Alert, oriented and cooperative. Patient is in no acute distress.  Skin: Skin is warm and dry. No rash noted.   Cardiovascular: Normal heart rate noted  Respiratory: Normal respiratory effort, no problems with respiration noted  Abdomen: Soft, gravid, appropriate for gestational age. Pain/Pressure: Present     Pelvic:  Cervical exam performed Dilation: Closed      Extremities: Normal range of motion.  Edema: Trace  Mental Status: Normal mood and affect. Normal behavior. Normal judgment and thought content.   Urinalysis:    neg/neg  Assessment and Plan:  Pregnancy: G2P1001 at 6320w1d  1. Supervision of normal pregnancy, antepartum, unspecified trimester Normal pregnancy no concerns, gbs and GC/CT collected today.   Preterm labor symptoms and general obstetric precautions including but not limited to vaginal bleeding, contractions, leaking of fluid and fetal movement were reviewed in detail with the patient. Please refer to After Visit  Summary for other counseling recommendations.  Return in about 1 week (around 11/20/2015) for LOB.   Lorne SkeensNicholas Michael Schenk, MD

## 2015-11-13 NOTE — Addendum Note (Signed)
Addended by: Loletha CarrowSCHENK, Alysah Carton M on: 11/13/2015 01:47 PM   Modules accepted: Orders

## 2015-11-14 LAB — GC/CHLAMYDIA PROBE AMP (~~LOC~~) NOT AT ARMC
Chlamydia: NEGATIVE
NEISSERIA GONORRHEA: NEGATIVE

## 2015-11-15 LAB — CULTURE, BETA STREP (GROUP B ONLY)

## 2015-11-29 ENCOUNTER — Ambulatory Visit (INDEPENDENT_AMBULATORY_CARE_PROVIDER_SITE_OTHER): Payer: Managed Care, Other (non HMO) | Admitting: Obstetrics and Gynecology

## 2015-11-29 DIAGNOSIS — Z3483 Encounter for supervision of other normal pregnancy, third trimester: Secondary | ICD-10-CM

## 2015-11-29 LAB — POCT URINALYSIS DIP (DEVICE)
Bilirubin Urine: NEGATIVE
Glucose, UA: NEGATIVE mg/dL
KETONES UR: NEGATIVE mg/dL
Leukocytes, UA: NEGATIVE
Nitrite: NEGATIVE
PH: 6.5 (ref 5.0–8.0)
PROTEIN: NEGATIVE mg/dL
SPECIFIC GRAVITY, URINE: 1.02 (ref 1.005–1.030)
UROBILINOGEN UA: 1 mg/dL (ref 0.0–1.0)

## 2015-11-29 NOTE — Progress Notes (Signed)
Subjective:  Lori Payne is a 25 y.o. G2P1001 at [redacted]w[redacted]d being seen today for ongoing prenatal care.  She is currently monitored for the following issues for this low-risk pregnancy and has Headache and Supervision of normal pregnancy, antepartum on her problem list.  Patient reports no complaints.  Contractions: Irregular. Vag. Bleeding: None.  Movement: Present. Denies leaking of fluid.   The following portions of the patient's history were reviewed and updated as appropriate: allergies, current medications, past family history, past medical history, past social history, past surgical history and problem list. Problem list updated.  Objective:   Vitals:   11/29/15 0955  BP: 112/66  Pulse: 78  Weight: 197 lb 1.6 oz (89.4 kg)    Fetal Status: Fetal Heart Rate (bpm): 142   Movement: Present     General:  Alert, oriented and cooperative. Patient is in no acute distress.  Skin: Skin is warm and dry. No rash noted.   Cardiovascular: Normal heart rate noted  Respiratory: Normal respiratory effort, no problems with respiration noted  Abdomen: Soft, gravid, appropriate for gestational age. Pain/Pressure: Present     Pelvic:  Cervical exam performed        Extremities: Normal range of motion.  Edema: Trace  Mental Status: Normal mood and affect. Normal behavior. Normal judgment and thought content.   Urinalysis:      Assessment and Plan:  Pregnancy: G2P1001 at [redacted]w[redacted]d  1. Supervision of normal pregnancy, antepartum, third trimester  - GBS negative  - GC negative  - NST @ 40 weeks   Term labor symptoms and general obstetric precautions including but not limited to vaginal bleeding, contractions, leaking of fluid and fetal movement were reviewed in detail with the patient. Please refer to After Visit Summary for other counseling recommendations.  Return in about 1 week (around 12/06/2015).   Duane Lope, NP

## 2015-12-07 ENCOUNTER — Encounter (HOSPITAL_COMMUNITY): Payer: Self-pay

## 2015-12-07 ENCOUNTER — Inpatient Hospital Stay (HOSPITAL_COMMUNITY)
Admission: AD | Admit: 2015-12-07 | Discharge: 2015-12-07 | Disposition: A | Discharge status: Home / Self Care | Payer: Managed Care, Other (non HMO) | Source: Ambulatory Visit | Attending: Obstetrics & Gynecology | Admitting: Obstetrics & Gynecology

## 2015-12-07 DIAGNOSIS — Z3A38 38 weeks gestation of pregnancy: Secondary | ICD-10-CM | POA: Diagnosis not present

## 2015-12-07 DIAGNOSIS — M545 Low back pain: Secondary | ICD-10-CM | POA: Diagnosis present

## 2015-12-07 DIAGNOSIS — O9989 Other specified diseases and conditions complicating pregnancy, childbirth and the puerperium: Secondary | ICD-10-CM | POA: Diagnosis not present

## 2015-12-07 DIAGNOSIS — O26893 Other specified pregnancy related conditions, third trimester: Secondary | ICD-10-CM | POA: Diagnosis not present

## 2015-12-07 DIAGNOSIS — M549 Dorsalgia, unspecified: Secondary | ICD-10-CM | POA: Diagnosis not present

## 2015-12-07 DIAGNOSIS — Z3483 Encounter for supervision of other normal pregnancy, third trimester: Secondary | ICD-10-CM

## 2015-12-07 LAB — URINALYSIS, ROUTINE W REFLEX MICROSCOPIC
Bilirubin Urine: NEGATIVE
GLUCOSE, UA: NEGATIVE mg/dL
HGB URINE DIPSTICK: NEGATIVE
Ketones, ur: NEGATIVE mg/dL
Leukocytes, UA: NEGATIVE
Nitrite: NEGATIVE
PROTEIN: NEGATIVE mg/dL
SPECIFIC GRAVITY, URINE: 1.015 (ref 1.005–1.030)
pH: 6.5 (ref 5.0–8.0)

## 2015-12-07 MED ORDER — CYCLOBENZAPRINE HCL 10 MG PO TABS: 10.0000 mg | ORAL_TABLET | Freq: Three times a day (TID) | ORAL | 0 refills | Status: DC | PRN

## 2015-12-07 MED ORDER — CYCLOBENZAPRINE HCL 10 MG PO TABS
10.0000 mg | ORAL_TABLET | Freq: Once | ORAL | Status: AC
  Administered 2015-12-07: 10 mg via ORAL
  Filled 2015-12-07: qty 1

## 2015-12-07 NOTE — Discharge Instructions (Signed)

## 2015-12-07 NOTE — MAU Provider Note (Signed)
History     CSN: [id number]  Arrival date and time: 12/07/15 1831     Chief Complaint  Patient presents with  . Back Pain  . Vaginal Discharge   HPI  Ms. Solyana Mcelwee is a 25 year old G2 P1 at 38 weeks and 4 days who presents with low back pain as well as paresthesias in her right leg. These been present for about 24 hours. She remembers no injuries. She reports the pain is worse with exercise or movement. She has no weakness in the legs. She reports the pain is achy in nature. She has not taken any medicine to help it improve. She denies pressure or contractions. She reports regular fetal movement and denies vaginal bleeding or any abnormal vaginal discharge. She has not experienced pain like this before.  OB History    Gravida Para Term Preterm AB Living   0 0 1   SAB TAB Ectopic Multiple Live Births   0 0 0 0 1      Past Medical History:  Diagnosis Date  . IBS (irritable bowel syndrome)     Past Surgical History:  Procedure Laterality Date  . NO PAST SURGERIES      Family History  Problem Relation Age of Onset  . Hypertension Mother   . Arthritis Mother   . Arthritis Father   . Arthritis Maternal Grandmother   . Arthritis Paternal Grandmother     Social History  Substance Use Topics  . Smoking status: Former Smoker    Types: Cigarettes    Quit date: 04/22/2015  . Smokeless tobacco: Never Used  . Alcohol use No     Comment: social    Allergies:  Allergies  Allergen Reactions  . Hydrocodone Nausea Only  . Dilaudid [Hydromorphone Hcl] Itching    Prescriptions Prior to Admission  Medication Sig Dispense Refill Last Dose  . acetaminophen (TYLENOL) 500 MG tablet Take 500-1,000 mg by mouth every 6 (six) hours as needed for mild pain or headache. Reported on 10/25/2015   12/07/2015 at Unknown time  . Evening Primrose Oil 1000 MG CAPS Take 1 capsule by mouth 3 (three) times daily.   Past Week at Unknown time  . Prenatal Vit-Fe Fumarate-FA (PRENATAL  MULTIVITAMIN) TABS tablet Take 1 tablet by mouth daily at 12 noon.   12/06/2015 at Unknown time  . cyclobenzaprine (FLEXERIL) 10 MG tablet Take 1 tablet (10 mg total) by mouth 2 (two) times daily as needed for muscle spasms. (Patient not taking: Reported on 10/11/2015) 20 tablet 0 Not Taking at Unknown time    Review of Systems  Constitutional: Negative for chills and fever.  HENT: Negative for congestion.   Eyes: Negative for blurred vision and double vision.  Respiratory: Negative for cough, sputum production and shortness of breath.   Cardiovascular: Negative for chest pain and palpitations.  Gastrointestinal: Negative for abdominal pain, heartburn, nausea and vomiting.  Genitourinary: Negative for dysuria, frequency and urgency.  Musculoskeletal: Positive for back pain, joint pain and neck pain.  Skin: Negative for itching and rash.  Neurological: Positive for dizziness. Negative for loss of consciousness and headaches.  Endo/Heme/Allergies: Negative for environmental allergies. Does not bruise/bleed easily.  Psychiatric/Behavioral: Negative for depression and suicidal ideas.   Physical Exam   Blood pressure 116/57, pulse 82, temperature 98 F (36.7 C), temperature source Oral, resp. rate 18, last menstrual period 03/06/2015.  Physical Exam  Constitutional: She appears well-developed and well-nourished.  HENT:  Head: Normocephalic and atraumatic.  Eyes:  EOM are normal. Pupils are equal, round, and reactive to light.  Neck: Normal range of motion. Neck supple.  Cardiovascular: Normal rate and regular rhythm.  Exam reveals no gallop and no friction rub.   No murmur heard. Respiratory: Effort normal and breath sounds normal. No respiratory distress.  GI: Soft. Bowel sounds are normal.  Gravid  Musculoskeletal:  5 out of 5 strength bilateral lower extremities, paraspinal muscle spasm worse on the right  Neurological:  1+ deep tendon reflex bilateral patella., Full sensation  bilateral lower extremities    MAU Course  Procedures  MDM In the MAU patient was examined and found to have likely musculoskeletal back pain. Patient received a dose of Flexeril in the MAU with significant relief. Patient got a prescription to take Flexeril at home. She was reassured and gland that nothing is wrong with the baby or her pregnancy.  Assessment and Plan  #1: Musculoskeletal back pain. Patient was given a prescription for Flexeril which she plans to take as needed and took in the MAU with significant relief.  Ernestina Pennaicholas Schenk 12/07/2015, 7:46 PM

## 2015-12-07 NOTE — MAU Note (Signed)
Been having sharp pains in "her spine", started this morning. Pain comes and goes.  Having a clear d/c, thin.

## 2015-12-07 NOTE — Progress Notes (Signed)
Notified of pt arrival in MAU and complaint. Will come see pt 

## 2015-12-12 ENCOUNTER — Ambulatory Visit (INDEPENDENT_AMBULATORY_CARE_PROVIDER_SITE_OTHER): Payer: Managed Care, Other (non HMO) | Admitting: Family

## 2015-12-12 DIAGNOSIS — Z3483 Encounter for supervision of other normal pregnancy, third trimester: Secondary | ICD-10-CM

## 2015-12-12 LAB — POCT URINALYSIS DIP (DEVICE)
BILIRUBIN URINE: NEGATIVE
Glucose, UA: NEGATIVE mg/dL
HGB URINE DIPSTICK: NEGATIVE
KETONES UR: NEGATIVE mg/dL
LEUKOCYTES UA: NEGATIVE
Nitrite: NEGATIVE
PH: 6 (ref 5.0–8.0)
Protein, ur: NEGATIVE mg/dL
Specific Gravity, Urine: 1.025 (ref 1.005–1.030)
Urobilinogen, UA: 1 mg/dL (ref 0.0–1.0)

## 2015-12-12 NOTE — Progress Notes (Signed)
Subjective:  Michel HarrowSabrina Bricco is a 25 y.o. G2P1001 at 266w2d being seen today for ongoing prenatal care.  She is currently monitored for the following issues for this low-risk pregnancy and has Headache and Supervision of normal pregnancy, antepartum on her problem list.  Patient reports no complaints.  Contractions: Irregular. Vag. Bleeding: None.  Movement: Present. Denies leaking of fluid.   The following portions of the patient's history were reviewed and updated as appropriate: allergies, current medications, past family history, past medical history, past social history, past surgical history and problem list. Problem list updated.  Objective:   Vitals:   12/12/15 1107  BP: 123/76  Pulse: 89  Weight: 198 lb 1.6 oz (89.9 kg)    Fetal Status: Fetal Heart Rate (bpm): 134 Fundal Height: 38 cm Movement: Present  Presentation: Vertex  General:  Alert, oriented and cooperative. Patient is in no acute distress.  Skin: Skin is warm and dry. No rash noted.   Cardiovascular: Normal heart rate noted  Respiratory: Normal respiratory effort, no problems with respiration noted  Abdomen: Soft, gravid, appropriate for gestational age. Pain/Pressure: Present     Pelvic:  Cervical exam performed Dilation: 1.5 Effacement (%): Thick Station: Ballotable  Extremities: Normal range of motion.  Edema: Trace  Mental Status: Normal mood and affect. Normal behavior. Normal judgment and thought content.   Urinalysis: Urine Protein: Negative Urine Glucose: Negative  Assessment and Plan:  Pregnancy: G2P1001 at 676w2d  1. Supervision of normal pregnancy, antepartum, third trimester - Reviewed signs of labor - Membrane sweeping today  Term labor symptoms and general obstetric precautions including but not limited to vaginal bleeding, contractions, leaking of fluid and fetal movement were reviewed in detail with the patient. Please refer to After Visit Summary for other counseling recommendations.  Return in  about 1 week (around 12/19/2015) for appt and NST.   Eino FarberWalidah Kennith GainN Karim, CNM

## 2015-12-15 ENCOUNTER — Inpatient Hospital Stay (HOSPITAL_COMMUNITY): Payer: Managed Care, Other (non HMO) | Admitting: Anesthesiology

## 2015-12-15 ENCOUNTER — Inpatient Hospital Stay (HOSPITAL_COMMUNITY)
Admission: AD | Admit: 2015-12-15 | Discharge: 2015-12-17 | DRG: 775 | Disposition: A | Discharge status: Home / Self Care | Payer: Managed Care, Other (non HMO) | Source: Ambulatory Visit | Attending: Obstetrics and Gynecology | Admitting: Obstetrics and Gynecology

## 2015-12-15 ENCOUNTER — Encounter (HOSPITAL_COMMUNITY): Payer: Self-pay | Admitting: *Deleted

## 2015-12-15 DIAGNOSIS — Z3A39 39 weeks gestation of pregnancy: Secondary | ICD-10-CM

## 2015-12-15 DIAGNOSIS — IMO0001 Reserved for inherently not codable concepts without codable children: Secondary | ICD-10-CM

## 2015-12-15 DIAGNOSIS — O4202 Full-term premature rupture of membranes, onset of labor within 24 hours of rupture: Principal | ICD-10-CM | POA: Diagnosis present

## 2015-12-15 DIAGNOSIS — Z3483 Encounter for supervision of other normal pregnancy, third trimester: Secondary | ICD-10-CM

## 2015-12-15 DIAGNOSIS — Z8249 Family history of ischemic heart disease and other diseases of the circulatory system: Secondary | ICD-10-CM | POA: Diagnosis not present

## 2015-12-15 DIAGNOSIS — Z87891 Personal history of nicotine dependence: Secondary | ICD-10-CM

## 2015-12-15 LAB — CBC
HCT: 31.6 % — ABNORMAL LOW (ref 36.0–46.0)
Hemoglobin: 10.5 g/dL — ABNORMAL LOW (ref 12.0–15.0)
MCH: 27.1 pg (ref 26.0–34.0)
MCHC: 33.2 g/dL (ref 30.0–36.0)
MCV: 81.7 fL (ref 78.0–100.0)
PLATELETS: 305 10*3/uL (ref 150–400)
RBC: 3.87 MIL/uL (ref 3.87–5.11)
RDW: 14.1 % (ref 11.5–15.5)
WBC: 10.3 10*3/uL (ref 4.0–10.5)

## 2015-12-15 LAB — TYPE AND SCREEN
ABO/RH(D): O POS
Antibody Screen: NEGATIVE

## 2015-12-15 LAB — POCT FERN TEST: POCT Fern Test: POSITIVE

## 2015-12-15 LAB — ABO/RH: ABO/RH(D): O POS

## 2015-12-15 MED ORDER — BENZOCAINE-MENTHOL 20-0.5 % EX AERO: 1.0000 "application " | INHALATION_SPRAY | CUTANEOUS | Status: DC | PRN

## 2015-12-15 MED ORDER — PHENYLEPHRINE 40 MCG/ML (10ML) SYRINGE FOR IV PUSH (FOR BLOOD PRESSURE SUPPORT)
80.0000 ug | PREFILLED_SYRINGE | INTRAVENOUS | Status: DC | PRN
  Filled 2015-12-15: qty 5

## 2015-12-15 MED ORDER — LIDOCAINE HCL (PF) 1 % IJ SOLN
30.0000 mL | INTRAMUSCULAR | Status: DC | PRN
  Filled 2015-12-15: qty 30

## 2015-12-15 MED ORDER — IBUPROFEN 600 MG PO TABS
600.0000 mg | ORAL_TABLET | Freq: Four times a day (QID) | ORAL | Status: DC
  Administered 2015-12-16 – 2015-12-17 (×7): 600 mg via ORAL
  Filled 2015-12-15 (×7): qty 1

## 2015-12-15 MED ORDER — ONDANSETRON HCL 4 MG/2ML IJ SOLN: 4.0000 mg | INTRAMUSCULAR | Status: DC | PRN

## 2015-12-15 MED ORDER — FENTANYL 2.5 MCG/ML BUPIVACAINE 1/10 % EPIDURAL INFUSION (WH - ANES)
14.0000 mL/h | INTRAMUSCULAR | Status: DC | PRN
  Administered 2015-12-15 (×2): 14 mL/h via EPIDURAL
  Filled 2015-12-15: qty 125

## 2015-12-15 MED ORDER — OXYCODONE-ACETAMINOPHEN 5-325 MG PO TABS: 1.0000 | ORAL_TABLET | ORAL | Status: DC | PRN

## 2015-12-15 MED ORDER — ACETAMINOPHEN 325 MG PO TABS: 650.0000 mg | ORAL_TABLET | ORAL | Status: DC | PRN

## 2015-12-15 MED ORDER — LIDOCAINE HCL (PF) 1 % IJ SOLN
INTRAMUSCULAR | Status: DC | PRN
  Administered 2015-12-15 (×2): 6 mL via EPIDURAL

## 2015-12-15 MED ORDER — DIPHENHYDRAMINE HCL 25 MG PO CAPS
25.0000 mg | ORAL_CAPSULE | Freq: Four times a day (QID) | ORAL | Status: DC | PRN
  Administered 2015-12-16: 25 mg via ORAL
  Filled 2015-12-15: qty 1

## 2015-12-15 MED ORDER — MISOPROSTOL 200 MCG PO TABS
50.0000 ug | ORAL_TABLET | ORAL | Status: DC | PRN
  Administered 2015-12-15 (×2): 50 ug via ORAL
  Filled 2015-12-15 (×2): qty 0.5

## 2015-12-15 MED ORDER — SIMETHICONE 80 MG PO CHEW: 80.0000 mg | CHEWABLE_TABLET | ORAL | Status: DC | PRN

## 2015-12-15 MED ORDER — ONDANSETRON HCL 4 MG PO TABS
4.0000 mg | ORAL_TABLET | ORAL | Status: DC | PRN
Start: 2015-12-15 — End: 2015-12-17

## 2015-12-15 MED ORDER — PRENATAL MULTIVITAMIN CH
1.0000 | ORAL_TABLET | Freq: Every day | ORAL | Status: DC
  Administered 2015-12-16 – 2015-12-17 (×2): 1 via ORAL
  Filled 2015-12-15 (×2): qty 1

## 2015-12-15 MED ORDER — EPHEDRINE 5 MG/ML INJ
10.0000 mg | INTRAVENOUS | Status: DC | PRN
  Filled 2015-12-15: qty 4

## 2015-12-15 MED ORDER — LACTATED RINGERS IV SOLN: 500.0000 mL | Freq: Once | INTRAVENOUS | Status: DC

## 2015-12-15 MED ORDER — DIBUCAINE 1 % RE OINT: 1.0000 "application " | TOPICAL_OINTMENT | RECTAL | Status: DC | PRN

## 2015-12-15 MED ORDER — COCONUT OIL OIL
1.0000 "application " | TOPICAL_OIL | Status: DC | PRN
  Administered 2015-12-17: 1 via TOPICAL
  Filled 2015-12-15: qty 120

## 2015-12-15 MED ORDER — FLEET ENEMA 7-19 GM/118ML RE ENEM: 1.0000 | ENEMA | RECTAL | Status: DC | PRN

## 2015-12-15 MED ORDER — OXYCODONE-ACETAMINOPHEN 5-325 MG PO TABS: 2.0000 | ORAL_TABLET | ORAL | Status: DC | PRN

## 2015-12-15 MED ORDER — SOD CITRATE-CITRIC ACID 500-334 MG/5ML PO SOLN: 30.0000 mL | ORAL | Status: DC | PRN

## 2015-12-15 MED ORDER — TETANUS-DIPHTH-ACELL PERTUSSIS 5-2.5-18.5 LF-MCG/0.5 IM SUSP: 0.5000 mL | Freq: Once | INTRAMUSCULAR | Status: DC

## 2015-12-15 MED ORDER — ONDANSETRON HCL 4 MG/2ML IJ SOLN: 4.0000 mg | Freq: Four times a day (QID) | INTRAMUSCULAR | Status: DC | PRN

## 2015-12-15 MED ORDER — WITCH HAZEL-GLYCERIN EX PADS: 1.0000 "application " | MEDICATED_PAD | CUTANEOUS | Status: DC | PRN

## 2015-12-15 MED ORDER — DIPHENHYDRAMINE HCL 50 MG/ML IJ SOLN: 12.5000 mg | INTRAMUSCULAR | Status: DC | PRN

## 2015-12-15 MED ORDER — TERBUTALINE SULFATE 1 MG/ML IJ SOLN
0.2500 mg | Freq: Once | INTRAMUSCULAR | Status: DC | PRN
  Filled 2015-12-15: qty 1

## 2015-12-15 MED ORDER — ZOLPIDEM TARTRATE 5 MG PO TABS: 5.0000 mg | ORAL_TABLET | Freq: Every evening | ORAL | Status: DC | PRN

## 2015-12-15 MED ORDER — FENTANYL CITRATE (PF) 100 MCG/2ML IJ SOLN
100.0000 ug | INTRAMUSCULAR | Status: DC | PRN
  Administered 2015-12-15: 100 ug via INTRAVENOUS
  Filled 2015-12-15: qty 2

## 2015-12-15 MED ORDER — SENNOSIDES-DOCUSATE SODIUM 8.6-50 MG PO TABS
2.0000 | ORAL_TABLET | ORAL | Status: DC
  Administered 2015-12-16 – 2015-12-17 (×2): 2 via ORAL
  Filled 2015-12-15 (×2): qty 2

## 2015-12-15 MED ORDER — OXYTOCIN 40 UNITS IN LACTATED RINGERS INFUSION - SIMPLE MED
2.5000 [IU]/h | INTRAVENOUS | Status: DC
  Filled 2015-12-15: qty 1000

## 2015-12-15 MED ORDER — OXYTOCIN BOLUS FROM INFUSION
500.0000 mL | Freq: Once | INTRAVENOUS | Status: AC
  Administered 2015-12-15: 999 mL/h via INTRAVENOUS

## 2015-12-15 MED ORDER — PHENYLEPHRINE 40 MCG/ML (10ML) SYRINGE FOR IV PUSH (FOR BLOOD PRESSURE SUPPORT)
80.0000 ug | PREFILLED_SYRINGE | INTRAVENOUS | Status: DC | PRN
  Filled 2015-12-15: qty 10
  Filled 2015-12-15: qty 5

## 2015-12-15 MED ORDER — LACTATED RINGERS IV SOLN
INTRAVENOUS | Status: DC
  Administered 2015-12-15: 18:00:00 via INTRAVENOUS

## 2015-12-15 MED ORDER — LACTATED RINGERS IV SOLN: 500.0000 mL | INTRAVENOUS | Status: DC | PRN

## 2015-12-15 MED ORDER — ACETAMINOPHEN 325 MG PO TABS
650.0000 mg | ORAL_TABLET | ORAL | Status: DC | PRN
  Administered 2015-12-16 – 2015-12-17 (×2): 650 mg via ORAL
  Filled 2015-12-15 (×2): qty 2

## 2015-12-15 MED ORDER — PHENYLEPHRINE 40 MCG/ML (10ML) SYRINGE FOR IV PUSH (FOR BLOOD PRESSURE SUPPORT)
80.0000 ug | PREFILLED_SYRINGE | INTRAVENOUS | Status: DC | PRN
Start: 2015-12-15 — End: 2015-12-15
  Filled 2015-12-15: qty 5

## 2015-12-15 NOTE — H&P (Signed)
LABOR AND DELIVERY ADMISSION HISTORY AND PHYSICAL NOTE  Lori Payne is a 25 y.o. female G2P1001 with IUP at 4157w5d by 7 wk US presenting for PROM.   She reports positive fetal movement. She denies vaginal bleeding. States 6AM had leaking of clear fluid, then around 7:20AM had a big gush of clear fluid. Prior to that she was contracting every 6 min, but has since stopped.   Prenatal History/Complications:  Past Medical History: Past Medical History:  Diagnosis Date  . IBS (irritable bowel syndrome)     Past Surgical History: Past Surgical History:  Procedure Laterality Date  . NO PAST SURGERIES      Obstetrical History: OB History    Gravida Para Term Preterm AB Living   2 1 1  0 0 1   SAB TAB Ectopic Multiple Live Births   0 0 0 0 1      Social History: Social History   Social History  . Marital status: Single    Spouse name: N/A  . Number of children: N/A  . Years of education: N/A   Social History Main Topics  . Smoking status: Former Smoker    Types: Cigarettes    Quit date: 04/22/2015  . Smokeless tobacco: Never Used  . Alcohol use No     Comment: social  . Drug use:     Types: Marijuana     Comment: quit marijuana 04/22/15 when found out pregnant  . Sexual activity: Yes    Birth control/ protection: None   Other Topics Concern  . None   Social History Narrative  . None    Family History: Family History  Problem Relation Age of Onset  . Hypertension Mother   . Arthritis Mother   . Arthritis Father   . Arthritis Maternal Grandmother   . Arthritis Paternal Grandmother     Allergies: Allergies  Allergen Reactions  . Hydrocodone Nausea Only  . Dilaudid [Hydromorphone Hcl] Itching    Prescriptions Prior to Admission  Medication Sig Dispense Refill Last Dose  . acetaminophen (TYLENOL) 500 MG tablet Take 500-1,000 mg by mouth every 6 (six) hours as needed for mild pain or headache. Reported on 10/25/2015   Past Week at Unknown time  .  cyclobenzaprine (FLEXERIL) 10 MG tablet Take 1 tablet (10 mg total) by mouth 2 (two) times daily as needed for muscle spasms. 20 tablet 0 Past Week at Unknown time  . Evening Primrose Oil 1000 MG CAPS Take 1 capsule by mouth 3 (three) times daily.   12/14/2015 at Unknown time  . Prenatal Vit-Fe Fumarate-FA (PRENATAL MULTIVITAMIN) TABS tablet Take 1 tablet by mouth daily at 12 noon.   Past Week at Unknown time     Review of Systems   All systems reviewed and negative except as stated in HPI  Blood pressure 107/64, pulse 116, temperature 97.8 F (36.6 C), temperature source Axillary, resp. rate 20, height 5\' 7"  (1.702 m), weight 198 lb (89.8 kg), last menstrual period 03/06/2015. General appearance: alert, cooperative and appears stated age Lungs: clear to auscultation bilaterally Heart: regular rate and rhythm Abdomen: soft, non-tender; bowel sounds normal Extremities: No calf swelling or tenderness Presentation: cephalic Fetal monitoring: 130, mod var, + accels, no decels Uterine activity: infrequent Dilation: 1.5 Effacement (%): 60 Station: -2 Exam by:: Guadlupe SpanishL. Seymour RN    Prenatal labs: ABO, Rh: O/POS/-- (01/24 1418) Antibody: NEG (01/24 1418) Rubella: !Error! RPR: NON REAC (05/31 0001)  HBsAg: NEGATIVE (01/24 1418)  HIV: NONREACTIVE (05/31 0001)  GBS: Negative (07/17 0000)  1 hr Glucola: 72 Genetic screening:  Normal Anatomy US: Normal -girl  Prenatal Transfer Tool  Maternal Diabetes: No Genetic Screening: Normal Maternal Ultrasounds/Referrals: Normal Fetal Ultrasounds or other Referrals:  None Maternal Substance Abuse:  No Significant Maternal Medications:  None Significant Maternal Lab Results: None  Results for orders placed or performed during the hospital encounter of 12/15/15 (from the past 24 hour(s))  Fern Test   Collection Time: 12/15/15  8:59 AM  Result Value Ref Range   POCT Fern Test Positive = ruptured amniotic membanes   CBC   Collection Time:  12/15/15 10:30 AM  Result Value Ref Range   WBC 10.3 4.0 - 10.5 K/uL   RBC 3.87 3.87 - 5.11 MIL/uL   Hemoglobin 10.5 (L) 12.0 - 15.0 g/dL   HCT 16.131.6 (L) 09.636.0 - 04.546.0 %   MCV 81.7 78.0 - 100.0 fL   MCH 27.1 26.0 - 34.0 pg   MCHC 33.2 30.0 - 36.0 g/dL   RDW 40.914.1 81.111.5 - 91.415.5 %   Platelets 305 150 - 400 K/uL    Patient Active Problem List   Diagnosis Date Noted  . Active labor at term 12/15/2015  . Supervision of normal pregnancy, antepartum 05/23/2015  . Headache 03/08/2015    Assessment: Lori Payne is a 25 y.o. G2P1001 at [redacted]w[redacted]d here for PROM.   #Labor: PROM, augmentation with PO cytotec #Pain: Epidural or other pain meds per maternal request #FWB: Cat I #ID:  GBS Neg #MOF: breast #MOC:Nexplanon #Circ:  N/A- girl  Jen MowElizabeth Mumaw, DO 12/15/2015, 11:25 AM

## 2015-12-15 NOTE — Anesthesia Preprocedure Evaluation (Signed)
Anesthesia Evaluation  Patient identified by MRN, date of birth, ID band Patient awake    Reviewed: Allergy & Precautions, NPO status , Patient's Chart, lab work & pertinent test results  History of Anesthesia Complications Negative for: history of anesthetic complications  Airway Mallampati: II  TM Distance: >3 FB Neck ROM: Full    Dental  (+) Dental Advisory Given   Pulmonary former smoker,    breath sounds clear to auscultation       Cardiovascular negative cardio ROS   Rhythm:Regular Rate:Normal     Neuro/Psych  Headaches,    GI/Hepatic negative GI ROS, (+)     substance abuse  marijuana use,   Endo/Other  negative endocrine ROS  Renal/GU negative Renal ROS     Musculoskeletal   Abdominal   Peds  Hematology negative hematology ROS (+) plt 305K   Anesthesia Other Findings   Reproductive/Obstetrics (+) Pregnancy                            Anesthesia Physical Anesthesia Plan  ASA: II  Anesthesia Plan: Epidural   Post-op Pain Management:    Induction:   Airway Management Planned: Natural Airway  Additional Equipment:   Intra-op Plan:   Post-operative Plan:   Informed Consent: I have reviewed the patients History and Physical, chart, labs and discussed the procedure including the risks, benefits and alternatives for the proposed anesthesia with the patient or authorized representative who has indicated his/her understanding and acceptance.     Plan Discussed with:   Anesthesia Plan Comments: (Patient identified. Risks/Benefits/Options discussed with patient including but not limited to bleeding, infection, nerve damage, paralysis, failed block, incomplete pain control, headache, blood pressure changes, nausea, vomiting, reactions to medication both or allergic, itching and postpartum back pain. Confirmed with bedside nurse the patient's most recent platelet count.  Confirmed with patient that they are not currently taking any anticoagulation, have any bleeding history or any family history of bleeding disorders. Patient expressed understanding and wished to proceed. All questions were answered. )        Anesthesia Quick Evaluation

## 2015-12-15 NOTE — MAU Note (Signed)
Pt woke up @ 0230, had lost mucus plug, contractions since 0530.  Started leaking fluid around 0600, clear.  Denies bleeding.

## 2015-12-15 NOTE — Anesthesia Procedure Notes (Signed)
Epidural Patient location during procedure: OB Start time: 12/15/2015 6:40 PM End time: 12/15/2015 7:01 PM  Staffing Anesthesiologist: Jairo BenJACKSON, Namari Breton Performed: anesthesiologist   Preanesthetic Checklist Completed: patient identified, surgical consent, pre-op evaluation, timeout performed, IV checked, risks and benefits discussed and monitors and equipment checked  Epidural Patient position: sitting Prep: site prepped and draped and DuraPrep Patient monitoring: heart rate, continuous pulse ox and blood pressure Approach: midline Location: L2-L3 Injection technique: LOR air  Needle:  Needle type: Tuohy  Needle gauge: 17 G Needle length: 9 cm Needle insertion depth: 4 cm Catheter type: closed end flexible Catheter size: 19 Gauge Catheter at skin depth: 10 cm Test dose: negative (1% lidocaine)  Additional Notes Pt identified in Labor room.  Monitors applied. Working IV access confirmed. Sterile prep, drape lumbar spine.  1% lido local L 2,3.  #17ga Touhy LOR air at 4 cm L 2,3, cath in easily to 10 cm skin. Test dose OK, cath dosed and infusion begun.  Patient asymptomatic, VSS, no heme aspirated, tolerated well.  Sandford Craze Fatuma Dowers, MD

## 2015-12-16 LAB — RPR: RPR Ser Ql: NONREACTIVE

## 2015-12-16 NOTE — Anesthesia Postprocedure Evaluation (Signed)
Anesthesia Post Note  Patient: Lori Payne  Procedure(s) Performed: * No procedures listed *  Patient location during evaluation: Mother Baby Anesthesia Type: Epidural Level of consciousness: awake and alert and oriented Pain management: satisfactory to patient Vital Signs Assessment: post-procedure vital signs reviewed and stable Respiratory status: spontaneous breathing and nonlabored ventilation Cardiovascular status: stable Postop Assessment: no headache, no backache, no signs of nausea or vomiting, adequate PO intake and patient able to bend at knees (patient up walking) Anesthetic complications: no     Last Vitals:  Vitals:   12/15/15 2355 12/16/15 0355  BP: (!) 105/57 (!) 103/46  Pulse: 90 73  Resp: 18 18  Temp: 36.6 C 36.9 C    Last Pain:  Vitals:   12/16/15 0745  TempSrc:   PainSc: 0-No pain   Pain Goal: Patients Stated Pain Goal: 0 (12/15/15 0816)               Madison HickmanGREGORY,Shalamar Plourde

## 2015-12-16 NOTE — Plan of Care (Signed)
Problem: Urinary Elimination: Goal: Ability to reestablish a normal urinary elimination pattern will improve Outcome: Completed/Met Date Met: 12/16/15 Got pt up to bathroom and she was successfully able to urinate.   Comments: Got pt up to bathroom and she was successfully able to urinate.

## 2015-12-16 NOTE — Lactation Note (Signed)
This note was copied from a baby's chart. Lactation Consultation Note  Patient Name: Lori Lori Payne [id number]'s Date: 12/16/2015 Reason for consult: Initial assessment  Baby is 3019 hours old and has been consistent at the breast for age per doc flow sheets.  Per mom baby recently breast fed for 15 mins at 340 pm with swallows.  LC saw a pacifier in crib. LC recommended to hold off using it until baby learns mom well for at least 3 weeks.  LC reviewed the need for Latch score every shift and encouraged mom to call with feeding cues and RN or LC would  Come and assess feeding.  Mother informed of post-discharge support and given phone number to the lactation department, including services for phone call assistance;  out-patient appointments; and breastfeeding support group. List of other breastfeeding resources in the community given in the handout. Encouraged  mother to call for problems or concerns related to breastfeeding.   Maternal Data Has patient been taught Hand Expression?: Yes (per mom MBU RN showed mom how to hand express )  Feeding Feeding Type:  (per mom baby recently breast fed around 340 p ) Length of feed: 15 min (per mom swallows noted )  LATCH Score/Interventions Latch: Grasps breast easily, tongue down, lips flanged, rhythmical sucking.  Audible Swallowing: A few with stimulation  Type of Nipple: Everted at rest and after stimulation  Comfort (Breast/Nipple): Soft / non-tender     Hold (Positioning): No assistance needed to correctly position infant at breast.  LATCH Score: 9  Lactation Tools Discussed/Used WIC Program: Yes   Consult Status Consult Status: Follow-up Date: 12/16/15 Follow-up type: In-patient    Kathrin Greathouseorio, Zack Crager Ann 12/16/2015, 4:42 PM

## 2015-12-16 NOTE — Progress Notes (Signed)
  CLINICAL SOCIAL WORK MATERNAL/CHILD NOTE  Patient Details  Name: Lori Payne MRN: [id number] Date of Birth: 12/15/2016  Date:  12/16/2015  Clinical Social Worker Initiating Note:  Peri Maris Date/ Time Initiated:  12/16/15/1446     Child's Name:  Lori Payne   Legal Guardian:  Mother   Need for Interpreter:  None   Date of Referral:  12/16/15     Reason for Referral:  Current Substance Use/Substance Use During Pregnancy    Referral Source:  RN   Address:  [address] [place] [address]  Phone number:  [phone]   Household Members:  Significant Other   Natural Supports (not living in the home):  Friends, Artist Supports: None   Employment: Unemployed   Type of Work:     Education:  Garment/textile technologist:  Multimedia programmer (private insurance expires in 12 days due to her age; has applied for pregnancy medicaid)   Other Resources:   (n/a)   Cultural/Religious Considerations Which May Impact Care:  None reported  Strengths:  Ability to meet basic needs , Compliance with medical plan , Home prepared for child    Risk Factors/Current Problems:  None   Cognitive State:  Able to Concentrate , Alert , Linear Thinking , Goal Oriented    Mood/Affect:  Calm    CSW Assessment: CSW met with MOB at bedside after receiving a consult due to positive drug screen for MOB early in pregnancy. CSW explained drug screen policy to MOB who was understanding. CSW informed MOB that the UDS was negative and that no CPS report would be made. She denies any other substance use.   MOB lives with FOB and is supported by [facility] who lives in [place]. MOB reports feeling confident in the resources she has and her ability to parent this new child. She reports having an 68 year old daughter as well. MOB will discharge to FOB house. MOB denies hx of anxiety or depression.   CSW Plan/Description:  No Further Intervention Required/No  Barriers to Discharge    Bo Mcclintock, LCSW 12/16/2015, 2:49 PM

## 2015-12-16 NOTE — Progress Notes (Signed)
POSTPARTUM PROGRESS NOTE  Post Partum Day 1 Subjective:  Michel HarrowSabrina Cartlidge is a 25 y.o. [id number] 5819w5d s/p SVD.  No acute events overnight.  Pt denies problems with ambulating, voiding or po intake.  She denies nausea or vomiting.  Pain is well controlled.  She has had flatus. She has not had bowel movement.  Lochia Small.   Objective: Blood pressure (!) 103/46, pulse 73, temperature 98.4 F (36.9 C), temperature source Oral, resp. rate 18, height 5\' 7"  (1.702 m), weight 198 lb (89.8 kg), last menstrual period 03/06/2015, SpO2 98 %, unknown if currently breastfeeding.  Physical Exam:  General: alert, cooperative and no distress Lochia:normal flow Chest: no respiratory distress Heart: regular rate, distal pulses intact Abdomen: +BS, soft, nontender,  Uterine Fundus: firm appropriately tender DVT Evaluation: No calf swelling or tenderness Extremities: trace edema   Recent Labs  12/15/15 1030  HGB 10.5*  HCT 31.6*    Assessment/Plan:  ASSESSMENT: Michel HarrowSabrina Kilty is a 25 y.o. [id number] 5819w5d s/p SVD  Plan for discharge tomorrow, Breastfeeding and Contraception nexplanon   LOS: 1 day   Ernestina Pennaicholas Emmaly Leech 12/16/2015, 9:20 AM

## 2015-12-17 MED ORDER — IBUPROFEN 600 MG PO TABS: 600.0000 mg | ORAL_TABLET | Freq: Four times a day (QID) | ORAL | 0 refills | Status: AC | PRN

## 2015-12-17 NOTE — Discharge Instructions (Signed)
NO SEX UNTIL AFTER YOU GET YOUR BIRTH CONTROL  ° °Postpartum Care After Vaginal Delivery °After you deliver your newborn (postpartum period), the usual stay in the hospital is 24-72 hours. If there were problems with your labor or delivery, or if you have other medical problems, you might be in the hospital longer.  °While you are in the hospital, you will receive help and instructions on how to care for yourself and your newborn during the postpartum period.  °While you are in the hospital: °· Be sure to tell your nurses if you have pain or discomfort, as well as where you feel the pain and what makes the pain worse. °· If you had an incision made near your vagina (episiotomy) or if you had some tearing during delivery, the nurses may put ice packs on your episiotomy or tear. The ice packs may help to reduce the pain and swelling. °· If you are breastfeeding, you may feel uncomfortable contractions of your uterus for a couple of weeks. This is normal. The contractions help your uterus get back to normal size. °· It is normal to have some bleeding after delivery. °· For the first 1-3 days after delivery, the flow is red and the amount may be similar to a period. °· It is common for the flow to start and stop. °· In the first few days, you may pass some small clots. Let your nurses know if you begin to pass large clots or your flow increases. °· Do not  flush blood clots down the toilet before having the nurse look at them. °· During the next 3-10 days after delivery, your flow should become more watery and pink or brown-tinged in color. °· Ten to fourteen days after delivery, your flow should be a small amount of yellowish-white discharge. °· The amount of your flow will decrease over the first few weeks after delivery. Your flow may stop in 6-8 weeks. Most women have had their flow stop by 12 weeks after delivery. °· You should change your sanitary pads frequently. °· Wash your hands thoroughly with soap and water  for at least 20 seconds after changing pads, using the toilet, or before holding or feeding your newborn. °· You should feel like you need to empty your bladder within the first 6-8 hours after delivery. °· In case you become weak, lightheaded, or faint, call your nurse before you get out of bed for the first time and before you take a shower for the first time. °· Within the first few days after delivery, your breasts may begin to feel tender and full. This is called engorgement. Breast tenderness usually goes away within 48-72 hours after engorgement occurs. You may also notice milk leaking from your breasts. If you are not breastfeeding, do not stimulate your breasts. Breast stimulation can make your breasts produce more milk. °· Spending as much time as possible with your newborn is very important. During this time, you and your newborn can feel close and get to know each other. Having your newborn stay in your room (rooming in) will help to strengthen the bond with your newborn.  It will give you time to get to know your newborn and become comfortable caring for your newborn. °· Your hormones change after delivery. Sometimes the hormone changes can temporarily cause you to feel sad or tearful. These feelings should not last more than a few days. If these feelings last longer than that, you should talk to your caregiver. °· If desired,   talk to your caregiver about methods of family planning or contraception. °· Talk to your caregiver about immunizations. Your caregiver may want you to have the following immunizations before leaving the hospital: °· Tetanus, diphtheria, and pertussis (Tdap) or tetanus and diphtheria (Td) immunization. It is very important that you and your family (including grandparents) or others caring for your newborn are up-to-date with the Tdap or Td immunizations. The Tdap or Td immunization can help protect your newborn from getting ill. °· Rubella immunization. °· Varicella (chickenpox)  immunization. °· Influenza immunization. You should receive this annual immunization if you did not receive the immunization during your pregnancy. °  °This information is not intended to replace advice given to you by your health care provider. Make sure you discuss any questions you have with your health care provider. °  °Document Released: 02/10/2007 Document Revised: 01/08/2012 Document Reviewed: 12/11/2011 °Elsevier Interactive Patient Education ©2016 [employer] ° °Breastfeeding °Deciding to breastfeed is one of the best choices you can make for you and your baby. A change in hormones during pregnancy causes your breast tissue to grow and increases the number and size of your milk ducts. These hormones also allow proteins, sugars, and fats from your blood supply to make breast milk in your milk-producing glands. Hormones prevent breast milk from being released before your baby is born as well as prompt milk flow after birth. Once breastfeeding has begun, thoughts of your baby, as well as his or her sucking or crying, can stimulate the release of milk from your milk-producing glands.  °BENEFITS OF BREASTFEEDING °For Your Baby °· Your first milk (colostrum) helps your baby's digestive system function better. °· There are antibodies in your milk that help your baby fight off infections. °· Your baby has a lower incidence of asthma, allergies, and sudden infant death syndrome. °· The nutrients in breast milk are better for your baby than infant formulas and are designed uniquely for your baby's needs. °· Breast milk improves your baby's brain development. °· Your baby is less likely to develop other conditions, such as childhood obesity, asthma, or type 2 diabetes mellitus. °For You °· Breastfeeding helps to create a very special bond between you and your baby. °· Breastfeeding is convenient. Breast milk is always available at the correct temperature and costs nothing. °· Breastfeeding helps to burn calories  and helps you lose the weight gained during pregnancy. °· Breastfeeding makes your uterus contract to its prepregnancy size faster and slows bleeding (lochia) after you give birth.   °· Breastfeeding helps to lower your risk of developing type 2 diabetes mellitus, osteoporosis, and breast or ovarian cancer later in life. °SIGNS THAT YOUR BABY IS HUNGRY °Early Signs of Hunger °· Increased alertness or activity. °· Stretching. °· Movement of the head from side to side. °· Movement of the head and opening of the mouth when the corner of the mouth or cheek is stroked (rooting). °· Increased sucking sounds, smacking lips, cooing, sighing, or squeaking. °· Hand-to-mouth movements. °· Increased sucking of fingers or hands. °Late Signs of Hunger °· Fussing. °· Intermittent crying. °Extreme Signs of Hunger °Signs of extreme hunger will require calming and consoling before your baby will be able to breastfeed successfully. Do not wait for the following signs of extreme hunger to occur before you initiate breastfeeding: °· Restlessness. °· A loud, strong cry. °· Screaming. °BREASTFEEDING BASICS °Breastfeeding Initiation °· Find a comfortable place to sit or lie down, with your neck and back well supported. °· Place   a pillow or rolled up blanket under your baby to bring him or her to the level of your breast (if you are seated). Nursing pillows are specially designed to help support your arms and your baby while you breastfeed. °· Make sure that your baby's abdomen is facing your abdomen. °· Gently massage your breast. With your fingertips, massage from your chest wall toward your nipple in a circular motion. This encourages milk flow. You may need to continue this action during the feeding if your milk flows slowly. °· Support your breast with 4 fingers underneath and your thumb above your nipple. Make sure your fingers are well away from your nipple and your baby's mouth. °· Stroke your baby's lips gently with your finger or  nipple. °· When your baby's mouth is open wide enough, quickly bring your baby to your breast, placing your entire nipple and as much of the colored area around your nipple (areola) as possible into your baby's mouth. °¨ More areola should be visible above your baby's upper lip than below the lower lip. °¨ Your baby's tongue should be between his or her lower gum and your breast. °· Ensure that your baby's mouth is correctly positioned around your nipple (latched). Your baby's lips should create a seal on your breast and be turned out (everted). °· It is common for your baby to suck about 2-3 minutes in order to start the flow of breast milk. °Latching °Teaching your baby how to latch on to your breast properly is very important. An improper latch can cause nipple pain and decreased milk supply for you and poor weight gain in your baby. Also, if your baby is not latched onto your nipple properly, he or she may swallow some air during feeding. This can make your baby fussy. Burping your baby when you switch breasts during the feeding can help to get rid of the air. However, teaching your baby to latch on properly is still the best way to prevent fussiness from swallowing air while breastfeeding. °Signs that your baby has successfully latched on to your nipple: °· Silent tugging or silent sucking, without causing you pain. °· Swallowing heard between every 3-4 sucks. °· Muscle movement above and in front of his or her ears while sucking. °Signs that your baby has not successfully latched on to nipple: °· Sucking sounds or smacking sounds from your baby while breastfeeding. °· Nipple pain. °If you think your baby has not latched on correctly, slip your finger into the corner of your baby's mouth to break the suction and place it between your baby's gums. Attempt breastfeeding initiation again. °Signs of Successful Breastfeeding °Signs from your baby: °· A gradual decrease in the number of sucks or complete cessation of  sucking. °· Falling asleep. °· Relaxation of his or her body. °· Retention of a small amount of milk in his or her mouth. °· Letting go of your breast by himself or herself. °Signs from you: °· Breasts that have increased in firmness, weight, and size 1-3 hours after feeding. °· Breasts that are softer immediately after breastfeeding. °· Increased milk volume, as well as a change in milk consistency and color by the fifth day of breastfeeding. °· Nipples that are not sore, cracked, or bleeding. °Signs That Your Baby is Getting Enough Milk °· Wetting at least 3 diapers in a 24-hour period. The urine should be clear and pale yellow by age 5 days. °· At least 3 stools in a 24-hour period by age   5 days. The stool should be soft and yellow. °· At least 3 stools in a 24-hour period by age 7 days. The stool should be seedy and yellow. °· No loss of weight greater than 10% of birth weight during the first 3 days of age. °· Average weight gain of 4-7 ounces (113-198 g) per week after age 4 days. °· Consistent daily weight gain by age 5 days, without weight loss after the age of 2 weeks. °After a feeding, your baby may spit up a small amount. This is common. °BREASTFEEDING FREQUENCY AND DURATION °Frequent feeding will help you make more milk and can prevent sore nipples and breast engorgement. Breastfeed when you feel the need to reduce the fullness of your breasts or when your baby shows signs of hunger. This is called "breastfeeding on demand." Avoid introducing a pacifier to your baby while you are working to establish breastfeeding (the first 4-6 weeks after your baby is born). After this time you may choose to use a pacifier. Research has shown that pacifier use during the first year of a baby's life decreases the risk of sudden infant death syndrome (SIDS). °Allow your baby to feed on each breast as long as he or she wants. Breastfeed until your baby is finished feeding. When your baby unlatches or falls asleep while  feeding from the first breast, offer the second breast. Because newborns are often sleepy in the first few weeks of life, you may need to awaken your baby to get him or her to feed. °Breastfeeding times will vary from baby to baby. However, the following rules can serve as a guide to help you ensure that your baby is properly fed: °· Newborns (babies 4 weeks of age or younger) may breastfeed every 1-3 hours. °· Newborns should not go longer than 3 hours during the day or 5 hours during the night without breastfeeding. °· You should breastfeed your baby a minimum of 8 times in a 24-hour period until you begin to introduce solid foods to your baby at around 6 months of age. °BREAST MILK PUMPING °Pumping and storing breast milk allows you to ensure that your baby is exclusively fed your breast milk, even at times when you are unable to breastfeed. This is especially important if you are going back to work while you are still breastfeeding or when you are not able to be present during feedings. Your lactation consultant can give you guidelines on how long it is safe to store breast milk. °A breast pump is a machine that allows you to pump milk from your breast into a sterile bottle. The pumped breast milk can then be stored in a refrigerator or freezer. Some breast pumps are operated by hand, while others use electricity. Ask your lactation consultant which type will work best for you. Breast pumps can be purchased, but some hospitals and breastfeeding support groups lease breast pumps on a monthly basis. A lactation consultant can teach you how to hand express breast milk, if you prefer not to use a pump. °CARING FOR YOUR BREASTS WHILE YOU BREASTFEED °Nipples can become dry, cracked, and sore while breastfeeding. The following recommendations can help keep your breasts moisturized and healthy: °· Avoid using soap on your nipples. °· Wear a supportive bra. Although not required, special nursing bras and tank tops are  designed to allow access to your breasts for breastfeeding without taking off your entire bra or top. Avoid wearing underwire-style bras or extremely tight bras. °· Air dry   your nipples for 3-4 minutes after each feeding. °· Use only cotton bra pads to absorb leaked breast milk. Leaking of breast milk between feedings is normal. °· Use lanolin on your nipples after breastfeeding. Lanolin helps to maintain your skin's normal moisture barrier. If you use pure lanolin, you do not need to wash it off before feeding your baby again. Pure lanolin is not toxic to your baby. You may also hand express a few drops of breast milk and gently massage that milk into your nipples and allow the milk to air dry. °In the first few weeks after giving birth, some women experience extremely full breasts (engorgement). Engorgement can make your breasts feel heavy, warm, and tender to the touch. Engorgement peaks within 3-5 days after you give birth. The following recommendations can help ease engorgement: °· Completely empty your breasts while breastfeeding or pumping. You may want to start by applying warm, moist heat (in the shower or with warm water-soaked hand towels) just before feeding or pumping. This increases circulation and helps the milk flow. If your baby does not completely empty your breasts while breastfeeding, pump any extra milk after he or she is finished. °· Wear a snug bra (nursing or regular) or tank top for 1-2 days to signal your body to slightly decrease milk production. °· Apply ice packs to your breasts, unless this is too uncomfortable for you. °· Make sure that your baby is latched on and positioned properly while breastfeeding. °If engorgement persists after 48 hours of following these recommendations, contact your health care provider or a lactation consultant. °OVERALL HEALTH CARE RECOMMENDATIONS WHILE BREASTFEEDING °· Eat healthy foods. Alternate between meals and snacks, eating 3 of each per day. Because  what you eat affects your breast milk, some of the foods may make your baby more irritable than usual. Avoid eating these foods if you are sure that they are negatively affecting your baby. °· Drink milk, fruit juice, and water to satisfy your thirst (about 10 glasses a day). °· Rest often, relax, and continue to take your prenatal vitamins to prevent fatigue, stress, and anemia. °· Continue breast self-awareness checks. °· Avoid chewing and smoking tobacco. Chemicals from cigarettes that pass into breast milk and exposure to secondhand smoke may harm your baby. °· Avoid alcohol and drug use, including marijuana. °Some medicines that may be harmful to your baby can pass through breast milk. It is important to ask your health care provider before taking any medicine, including all over-the-counter and prescription medicine as well as vitamin and herbal supplements. °It is possible to become pregnant while breastfeeding. If birth control is desired, ask your health care provider about options that will be safe for your baby. °SEEK MEDICAL CARE IF: °· You feel like you want to stop breastfeeding or have become frustrated with breastfeeding. °· You have painful breasts or nipples. °· Your nipples are cracked or bleeding. °· Your breasts are red, tender, or warm. °· You have a swollen area on either breast. °· You have a fever or chills. °· You have nausea or vomiting. °· You have drainage other than breast milk from your nipples. °· Your breasts do not become full before feedings by the fifth day after you give birth. °· You feel sad and depressed. °· Your baby is too sleepy to eat well. °· Your baby is having trouble sleeping.   °· Your baby is wetting less than 3 diapers in a 24-hour period. °· Your baby has less than 3 stools in   a 24-hour period. °· Your baby's skin or the white part of his or her eyes becomes yellow.   °· Your baby is not gaining weight by 5 days of age. °SEEK IMMEDIATE MEDICAL CARE IF: °· Your baby  is overly tired (lethargic) and does not want to wake up and feed. °· Your baby develops an unexplained fever. °  °This information is not intended to replace advice given to you by your health care provider. Make sure you discuss any questions you have with your health care provider. °  °Document Released: 04/15/2005 Document Revised: 01/04/2015 Document Reviewed: 10/07/2012 °Elsevier Interactive Patient Education ©2016 [employer]. ° ° °

## 2015-12-17 NOTE — Discharge Summary (Signed)
OB Discharge Summary     Patient Name: Lori Payne DOB: 1990-06-30 MRN: [id number]  Date of admission: 12/15/2015 Delivering MD: Lorne SkeensSCHENK, NICHOLAS MICHAEL   Date of discharge: 12/17/2015  Admitting diagnosis: 39WKS,WATER BROKE,CTX Intrauterine pregnancy: 4668w5d     Secondary diagnosis:  Active Problems:   Active labor at term  Additional problems: none     Discharge diagnosis: Term Pregnancy Delivered                                                                                                Post partum procedures:none  Augmentation: Pitocin and Cytotec  Complications: None  Hospital course:  Induction of Labor With Vaginal Delivery   25 y.o. yo G2P2002 at 3468w5d was admitted to the hospital 12/15/2015 for induction of labor.  Indication for induction: PROM.  Patient had an uncomplicated labor course as follows: Membrane Rupture Time/Date: 6:00 AM ,12/15/2015   Intrapartum Procedures: Episiotomy: None [1]                                         Lacerations:  None [1]  Patient had delivery of a Viable infant.  Information for the patient's newborn:  Paul DykesSmith, Girl Mccayla [[id number]][[id number]]  Delivery Method: Vaginal, Spontaneous Delivery (Filed from Delivery Summary)   12/15/2015  Details of delivery can be found in separate delivery note.  Patient had a routine postpartum course. Patient is discharged home 12/17/15.   Physical exam Vitals:   12/15/15 2355 12/16/15 0355 12/16/15 1500 12/16/15 1750  BP: (!) 105/57 (!) 103/46 (!) 107/58 (!) 102/53  Pulse: 90 73 77 84  Resp: 18 18 20 14   Temp: 97.8 F (36.6 C) 98.4 F (36.9 C) 98.2 F (36.8 C) 97.8 F (36.6 C)  TempSrc: Oral Oral Oral Oral  SpO2: 100% 98%  97%  Weight:      Height:       General: alert, cooperative and no distress Lochia: appropriate Uterine Fundus: firm Incision: N/A DVT Evaluation: No evidence of DVT seen on physical exam. Negative Homan's sign. No cords or calf tenderness. No significant calf/ankle  edema. Labs: Lab Results  Component Value Date   WBC 10.3 12/15/2015   HGB 10.5 (L) 12/15/2015   HCT 31.6 (L) 12/15/2015   MCV 81.7 12/15/2015   PLT 305 12/15/2015   CMP Latest Ref Rng & Units 04/17/2015  Glucose 65 - 99 mg/dL 80  BUN 6 - 20 mg/dL 11  Creatinine 7.820.44 - 9.561.00 mg/dL 2.130.69  Sodium 086135 - 578145 mmol/L 135  Potassium 3.5 - 5.1 mmol/L 4.4  Chloride 101 - 111 mmol/L 106  CO2 22 - 32 mmol/L 22  Calcium 8.9 - 10.3 mg/dL 9.1  Total Protein 6.5 - 8.1 g/dL -  Total Bilirubin 0.3 - 1.2 mg/dL -  Alkaline Phos 38 - 469126 U/L -  AST 15 - 41 U/L -  ALT 14 - 54 U/L -    Discharge instruction: per After Visit Summary and "Baby and Me Booklet".  After visit  meds:    Medication List    STOP taking these medications   cyclobenzaprine 10 MG tablet Commonly known as:  FLEXERIL   Evening Primrose Oil 1000 MG Caps     TAKE these medications   acetaminophen 500 MG tablet Commonly known as:  TYLENOL Take 500-1,000 mg by mouth every 6 (six) hours as needed for mild pain or headache. Reported on 10/25/2015   ibuprofen 600 MG tablet Commonly known as:  ADVIL,MOTRIN Take 1 tablet (600 mg total) by mouth every 6 (six) hours as needed for mild pain, moderate pain or cramping.   prenatal multivitamin Tabs tablet Take 1 tablet by mouth daily at 12 noon.       Diet: routine diet  Activity: Advance as tolerated. Pelvic rest for 6 weeks.   Outpatient follow up:6 weeks Follow up Appt:Future Appointments Date Time Provider Department Center  01/29/2016 8:20 AM Donette LarryMelanie Bhambri, CNM [facility]   Follow up Visit:No Follow-up on file.  Postpartum contraception: abstinence until nexplanon insertion  Newborn Data: Live born female  Birth Weight: 6 lb 13 oz (3090 g) APGAR: 9, 9  Baby Feeding: Breast Disposition:home with mother pending d/c from peds   12/17/2015 Marge DuncansBooker, Lori Payne, CNM

## 2015-12-17 NOTE — Lactation Note (Signed)
This note was copied from a baby's chart. Lactation Consultation Note  Patient Name: Lori Payne ZOXWR'UToday's Date: 12/17/2015  Follow up visit made prior to discharge.  Mom states breastfeeding is going well and she is anxious for her milk to come in.  Discussed milk coming to volume and engorgement treatment.  No questions at present.  Lactation services and support information reviewed and encouraged prn.   Maternal Data    Feeding    LATCH Score/Interventions                      Lactation Tools Discussed/Used     Consult Status      Huston FoleyMOULDEN, Livy Ross S 12/17/2015, 11:58 AM

## 2015-12-20 ENCOUNTER — Encounter: Payer: Managed Care, Other (non HMO) | Admitting: Advanced Practice Midwife

## 2016-01-29 ENCOUNTER — Ambulatory Visit: Payer: Managed Care, Other (non HMO) | Admitting: Certified Nurse Midwife

## 2016-02-25 IMAGING — US US TRANSVAGINAL NON-OB
1 series · 13 of 25 positions shown · non-contrast
Comparison: None

CLINICAL DATA: Bilateral pelvic pain for 1 year. IUD placed 2 years
prior.



[Series 1: us transvaginal non-ob · 0.24mm/px · 56 acquisitions, 13 frames shown]
[im 1/56]
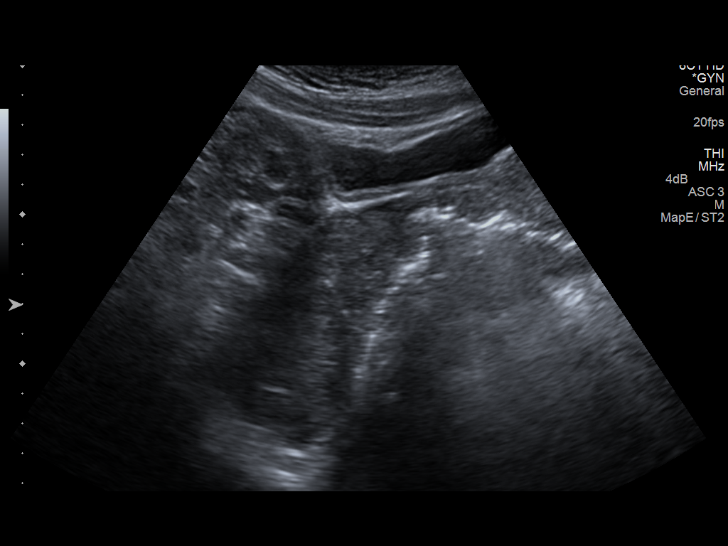
[im 5/56]
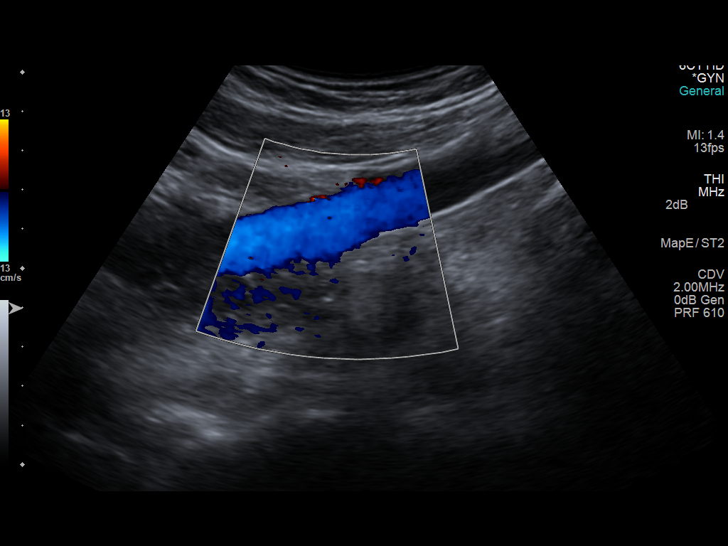
[im 10/56]
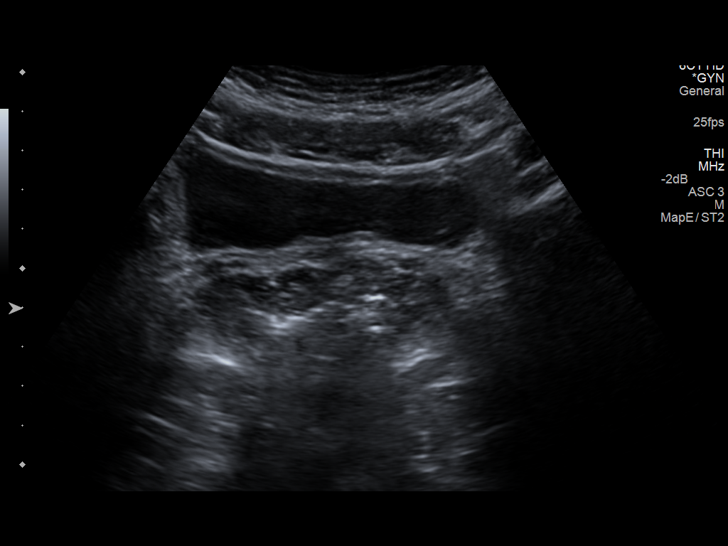
[im 14/56]
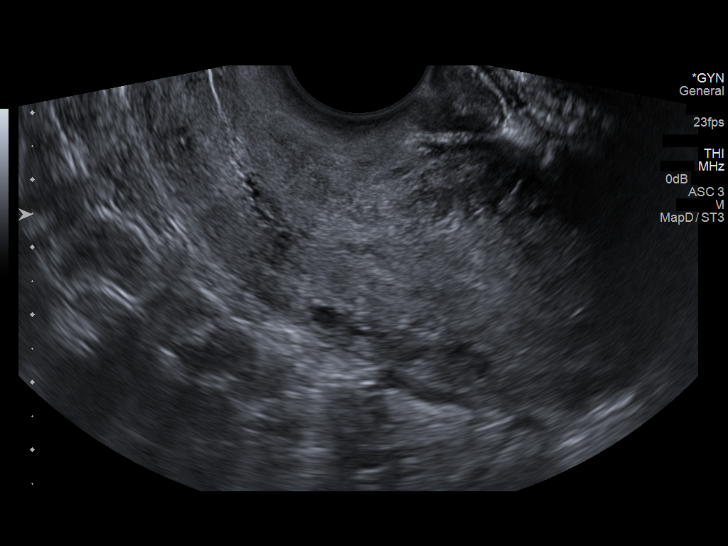
[im 19/56]
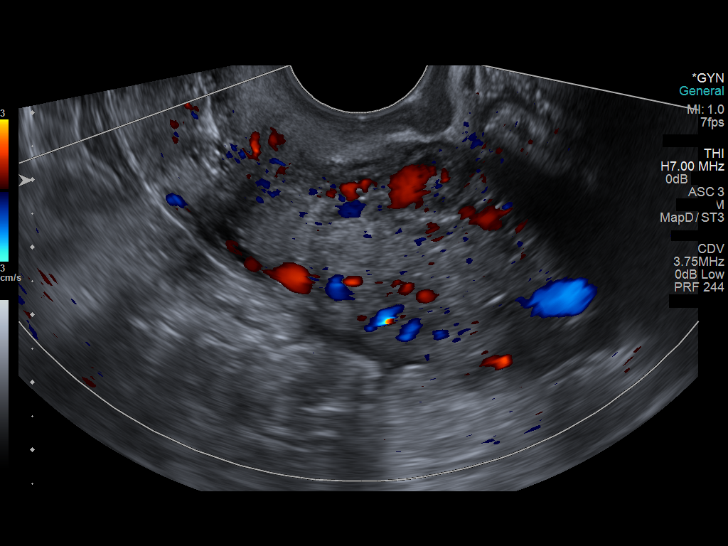
[im 23/56]
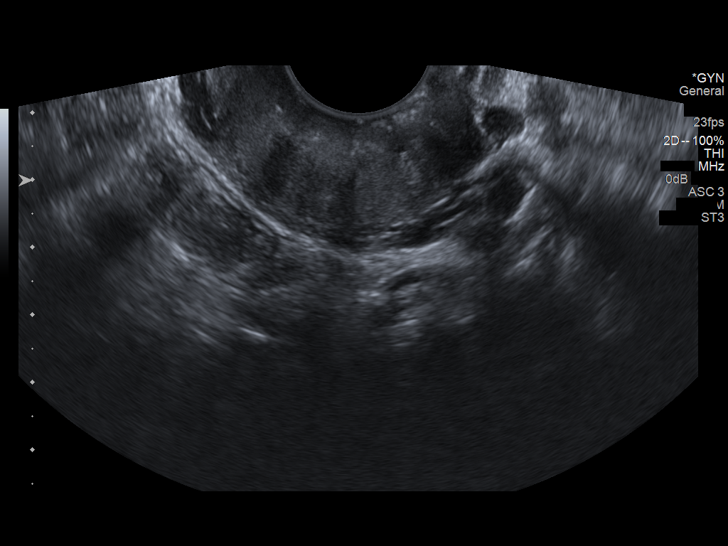
[im 28/56]
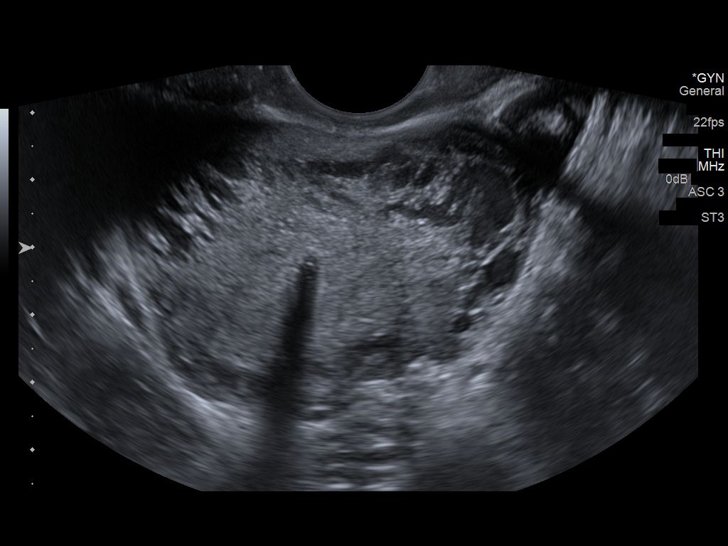
[im 33/56]
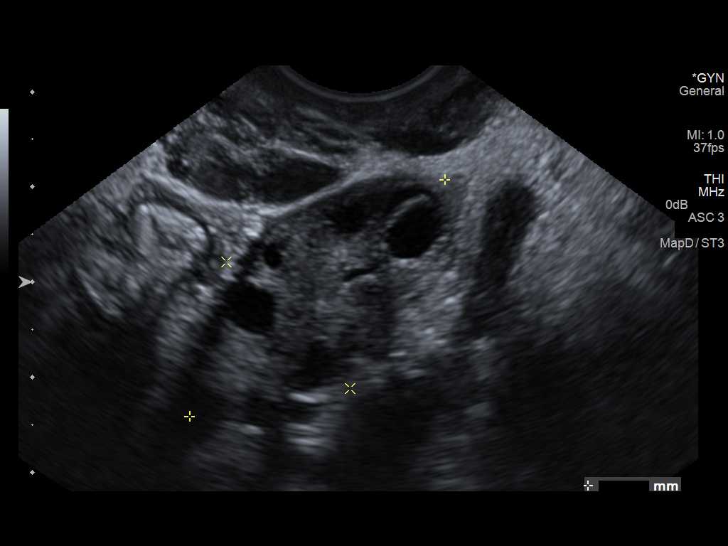
[im 37/56]
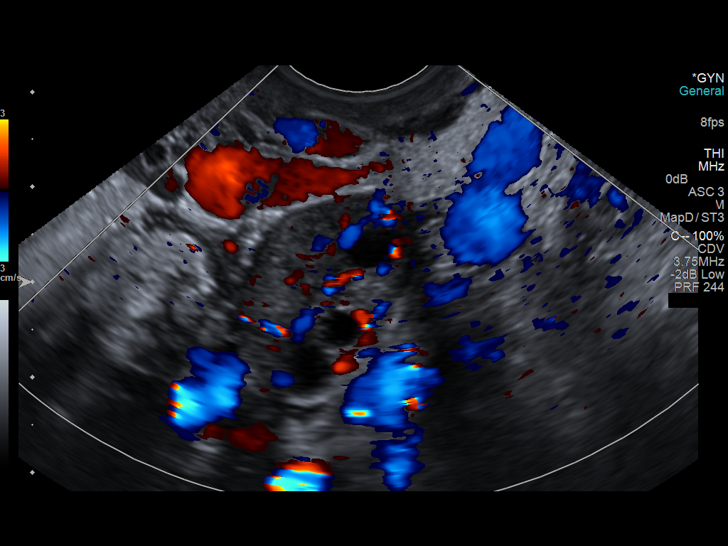
[im 42/56]
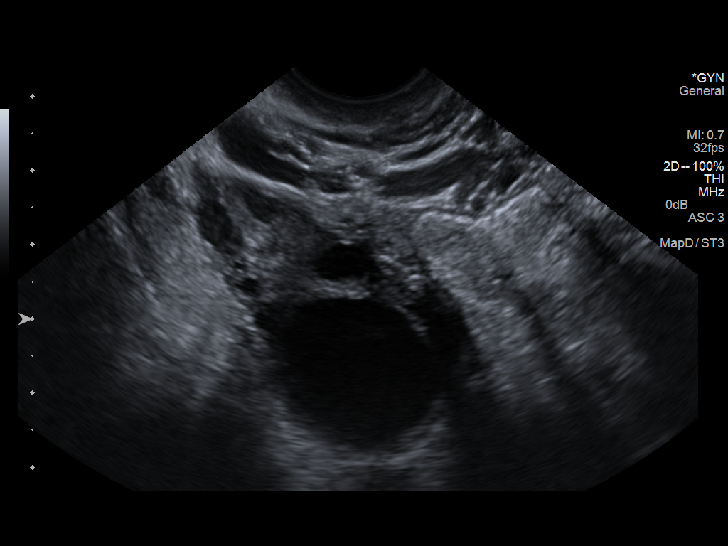
[im 46/56]
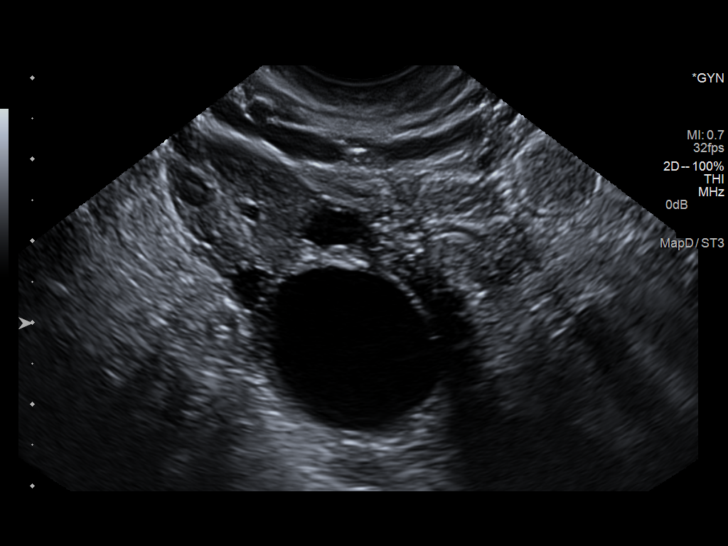
[im 51/56]
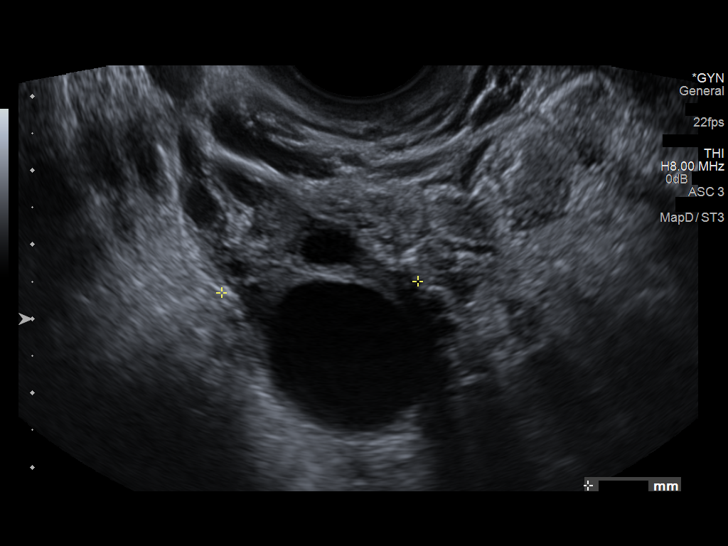
[im 56/56]
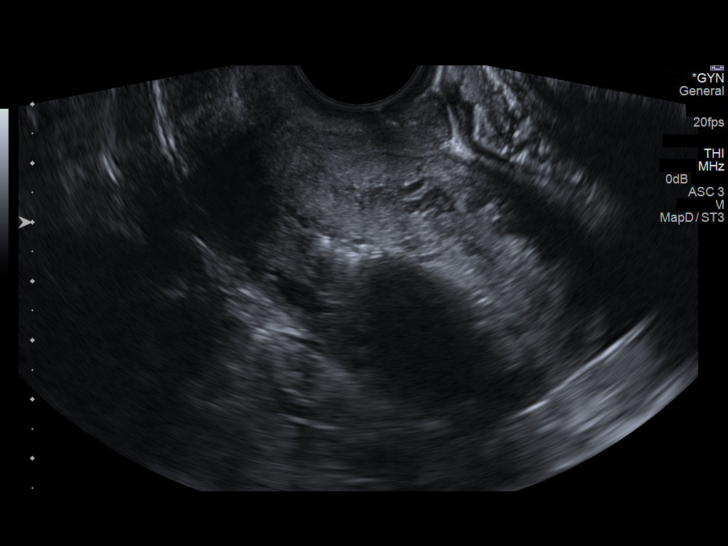

[13 of 25 positions shown; findings below may reference images not displayed]

FINDINGS: Uterus

Measurements: Retroverted uterus is normal in size and echogenicity
measuring 8.9 x 1.3 x 5.1 cm. There is an IUD in expected location
in the uterus.. No fibroids or other mass visualized.

Endometrium

Thickness: Normal in thickness at 6 mm.  IUD noted.  Right ovary

Measurements: Normal in size at 4.8 x 2.3 x 2.7 cm. Multiple small
follicles are present.. A single dominant follicle.

Left ovary

Measurements: Normal size and echogenicity measuring 3.7 x 1.9 x
cm. Multiple small follicles..

Other findings

There multiple prominent venous vessels within the pelvis.
IMPRESSION: 1. Normal uterus with IUD in expected location.
2. Normal endometrium.
3. Normal ovaries.
4. Multiple venous structures in the pelvis. While nonspecific, in
patient with chronic pelvic pain the patient could be evaluated for
pelvic vascular congestion syndrome.

## 2016-04-17 IMAGING — US US OB TRANSVAGINAL
1 series · 13 of 28 positions shown · non-contrast
Comparison: Ultrasound 02/24/2015

CLINICAL DATA: Pelvic pain 1 month. Quantitative beta HCG [DATE].
Estimated gestational age 9 weeks 3 days per LMP.

EXAM:
OBSTETRIC <14 WK US AND TRANSVAGINAL OB US
TECHNIQUE: Both transabdominal and transvaginal ultrasound examinations were
performed for complete evaluation of the gestation as well as the
maternal uterus, adnexal regions, and pelvic cul-de-sac.
Transvaginal technique was performed to assess early pregnancy.

[Series 1: us ob transvaginal · 0.23mm/px · 49 acquisitions, 13 frames shown]
[im 2/49]
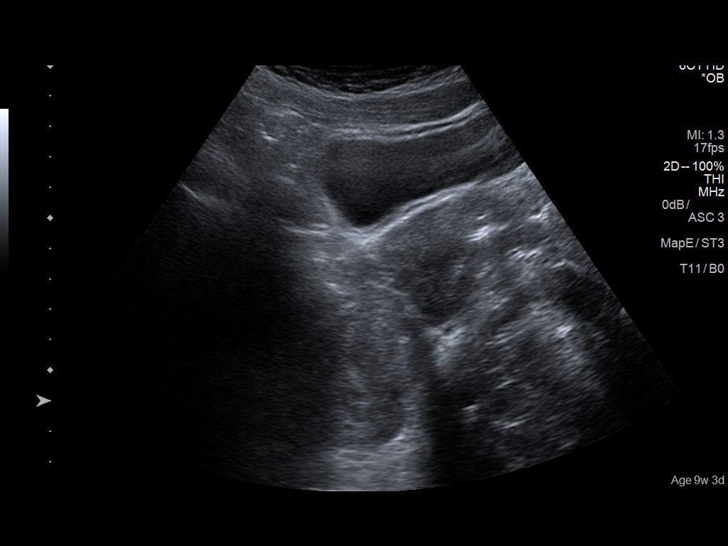
[im 6/49]
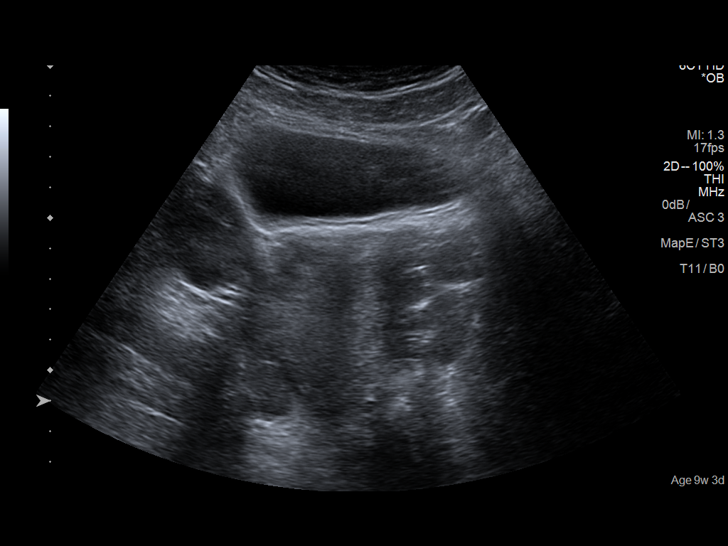
[im 9/49]
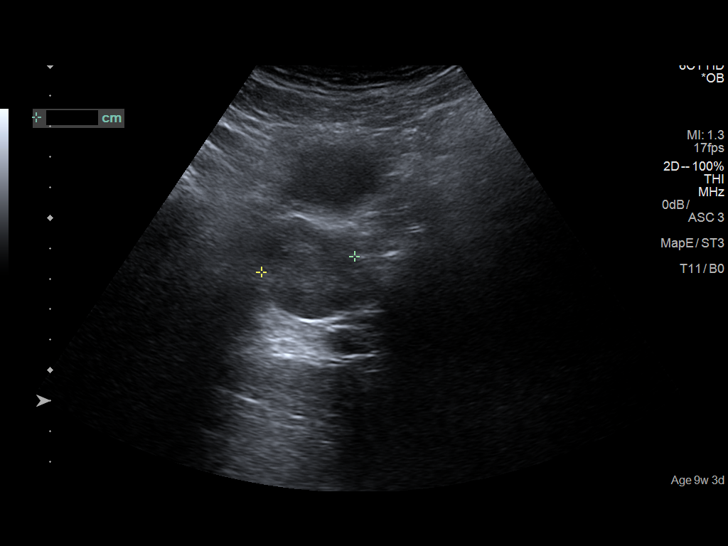
[im 13/49]
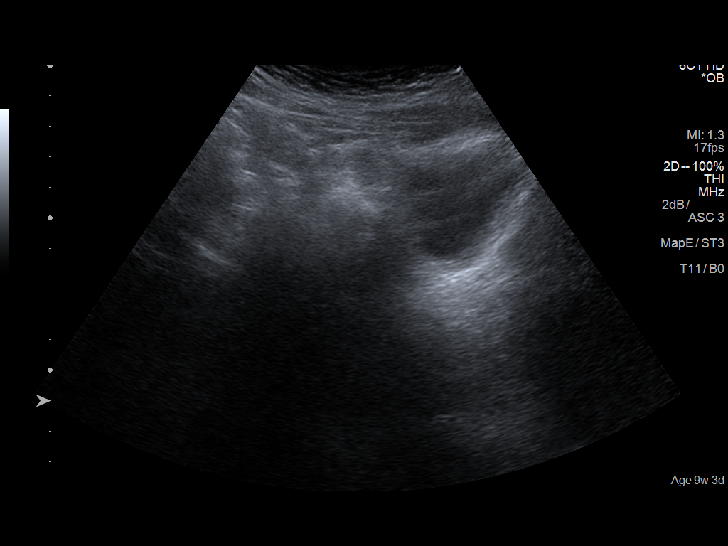
[im 17/49]
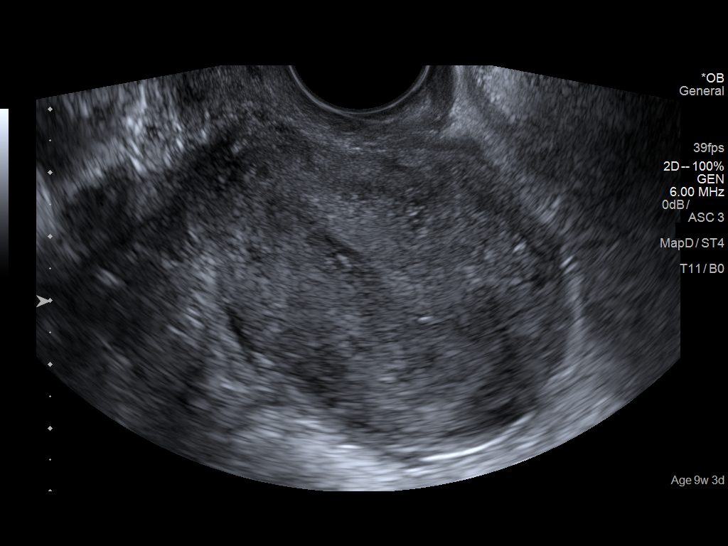
[im 20/49]
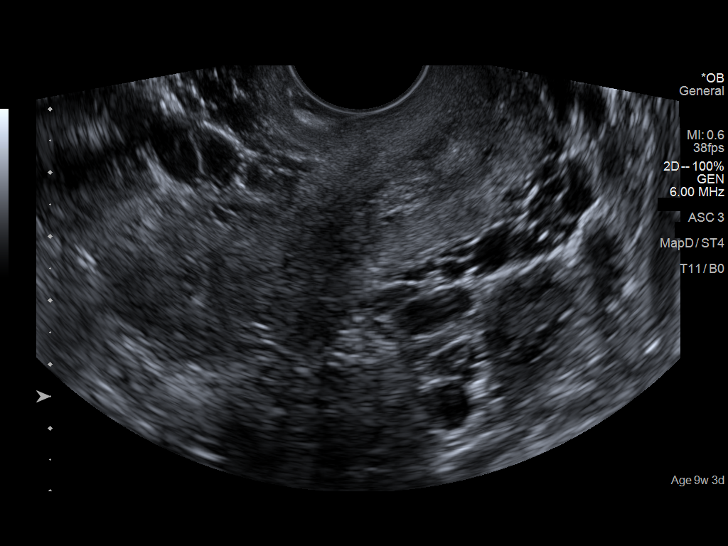
[im 25/49]
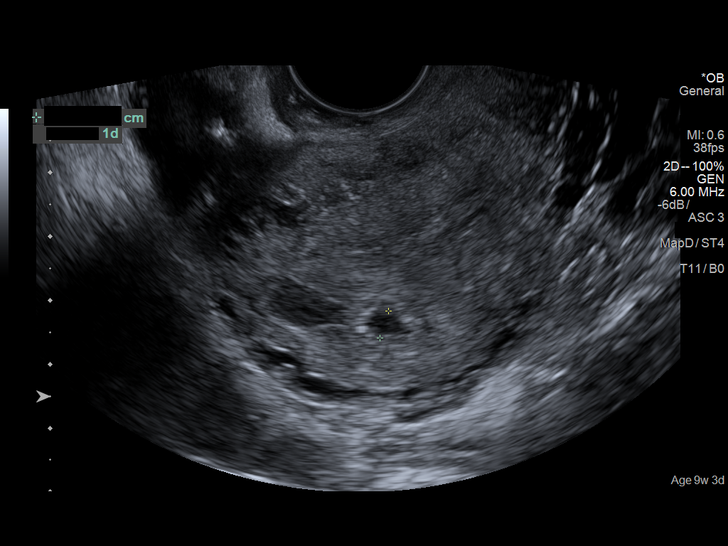
[im 29/49]
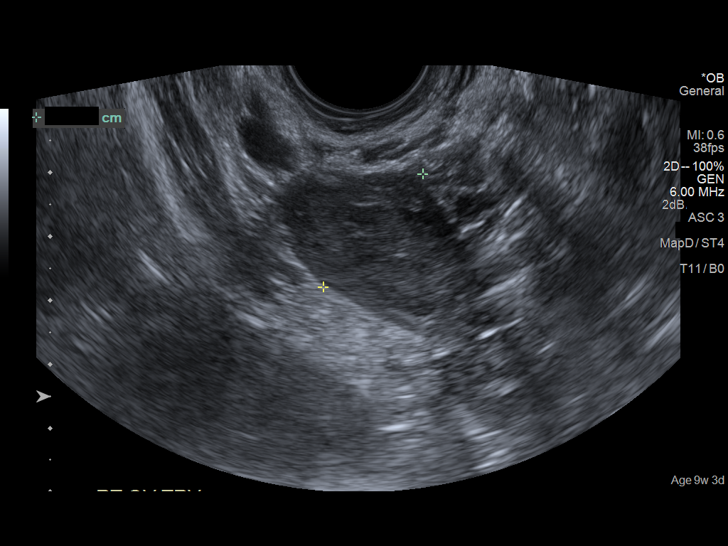
[im 33/49]
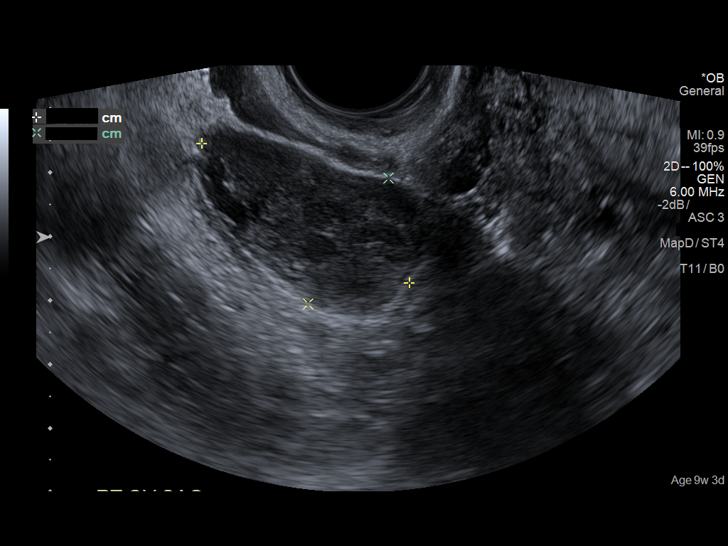
[im 36/49]
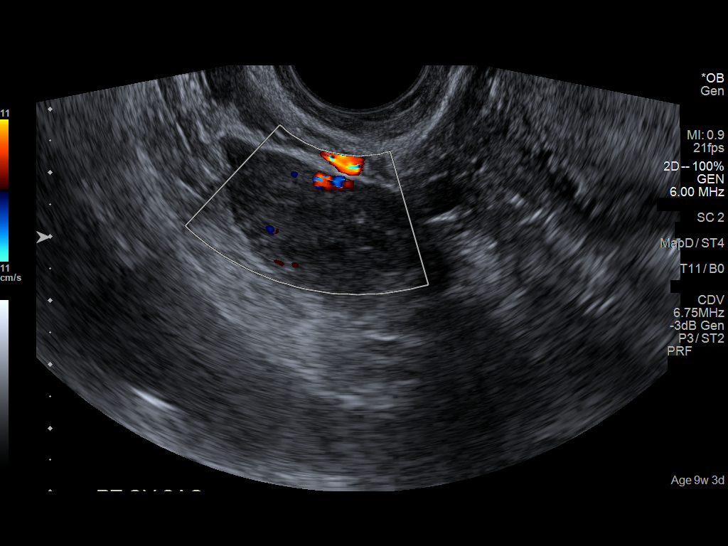
[im 40/49]
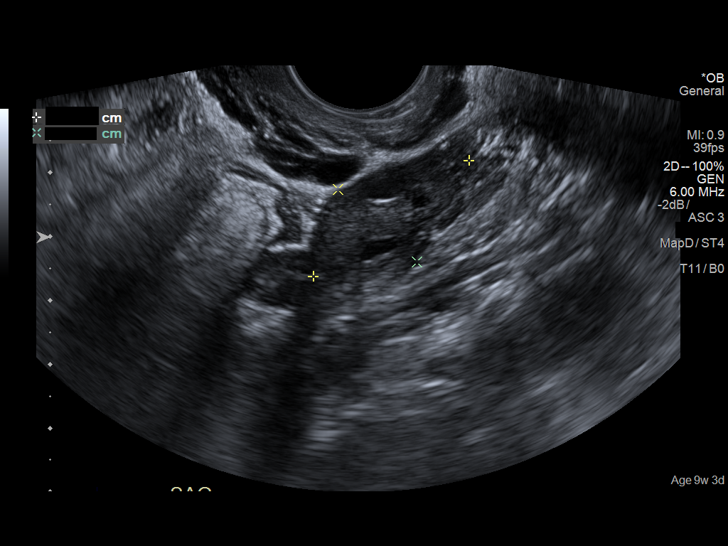
[im 43/49]
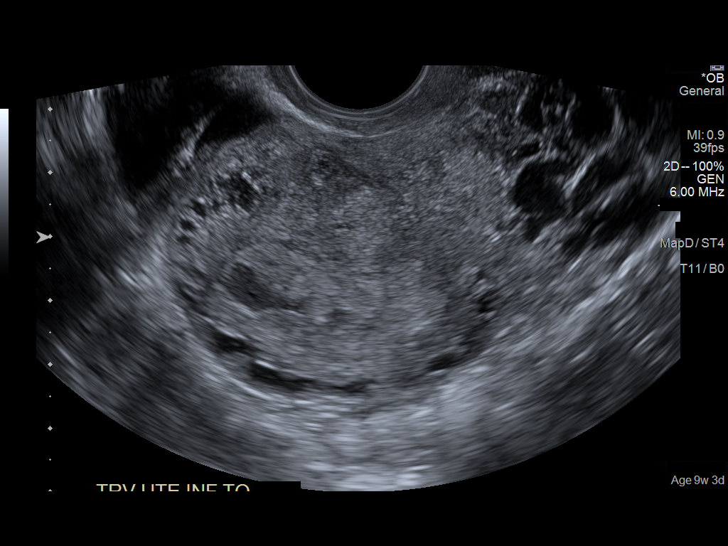
[im 47/49]
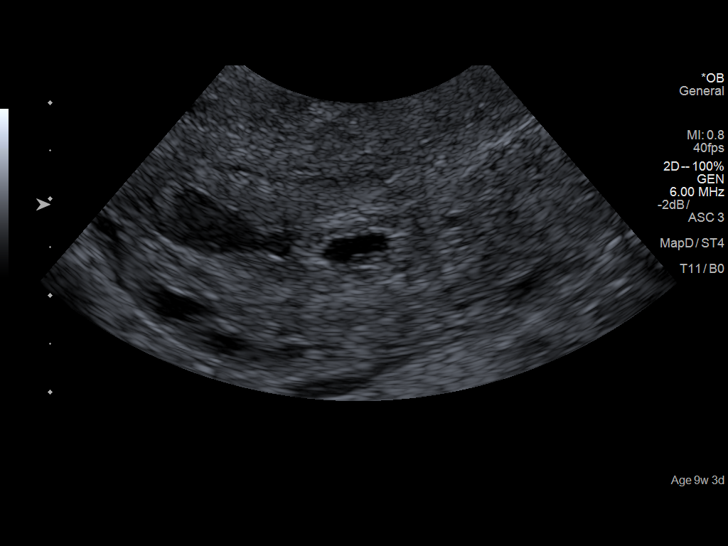

[13 of 28 positions shown; findings below may reference images not displayed]

FINDINGS: Intrauterine gestational sac: Visualized/normal in shape.

Yolk sac:  Not visualized.

Embryo:  Not visualized.

Cardiac Activity: Not visualized.

Heart Rate: Not visualized.  Bpm

MSD: 6.6  mm   5 w   3  d

US EDC: 12/15/2015

Subchorionic hemorrhage: Small to moderate size subchorionic
hemorrhage.

Maternal uterus/adnexae: 1.2 cm corpus luteum right ovary. Ovaries
are otherwise unremarkable. Trace free pelvic fluid.
IMPRESSION: Intrauterine gestational sac with mean sac diameter compatible with
estimated gestational age 5 weeks 3 days. No yolk sac or embryo
visualized. Findings likely represent a normal early pregnancy.
Recommend serial quantitative beta HCG and follow-up ultrasound
10-14 days.

Small to moderate subchorionic hemorrhage.

## 2016-05-03 IMAGING — US US OB TRANSVAGINAL
1 series · 14 of 28 positions shown · non-contrast
Comparison: 04/17/2015

CLINICAL DATA: Followup viability.

EXAM:
OBSTETRIC <14 WK US AND TRANSVAGINAL OB US
TECHNIQUE: Both transabdominal and transvaginal ultrasound examinations were
performed for complete evaluation of the gestation as well as the
maternal uterus, adnexal regions, and pelvic cul-de-sac.
Transvaginal technique was performed to assess early pregnancy.

[Series 1: us ob transvaginal · 0.14mm/px · 31 acquisitions, 14 frames shown]
[im 2/31]
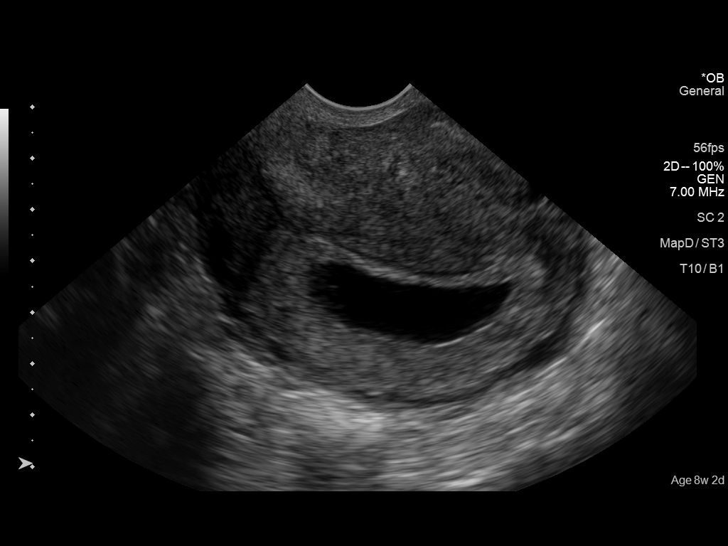
[im 4/31]
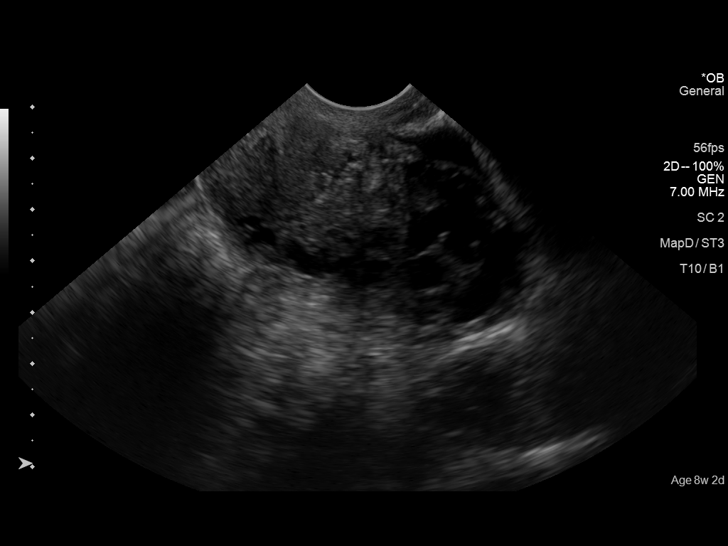
[im 6/31]
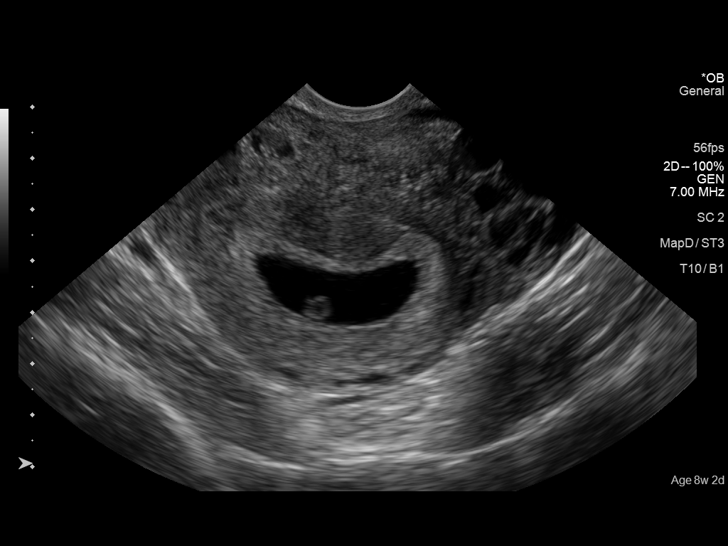
[im 8/31]
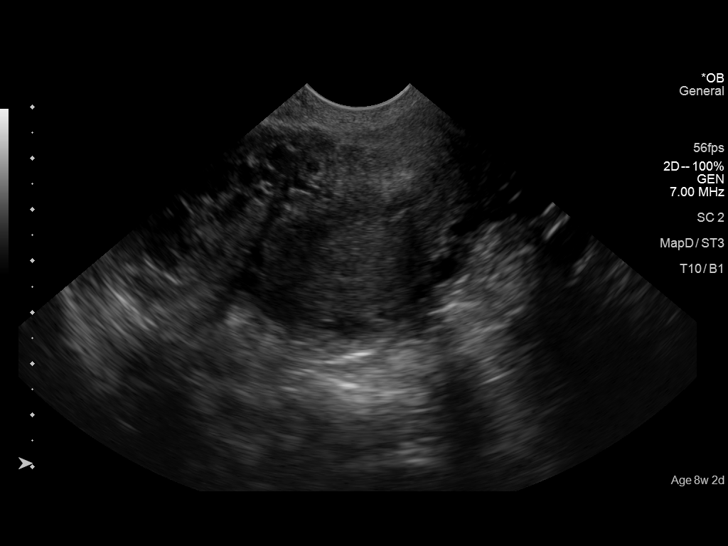
[im 11/31]
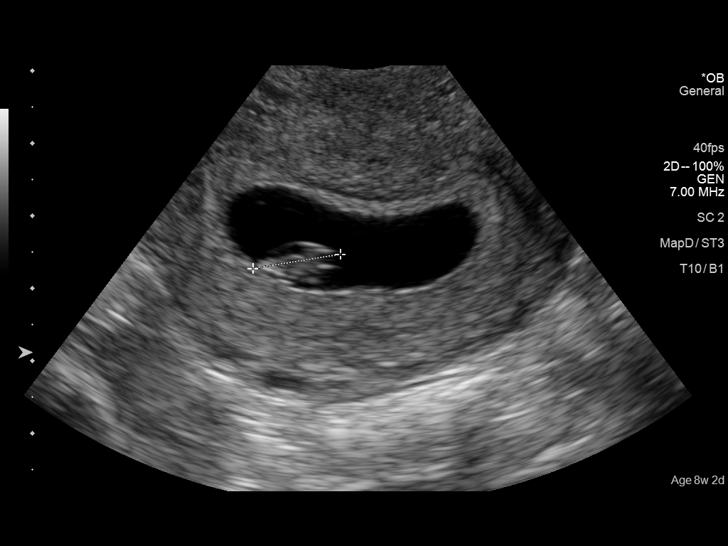
[im 13/31]
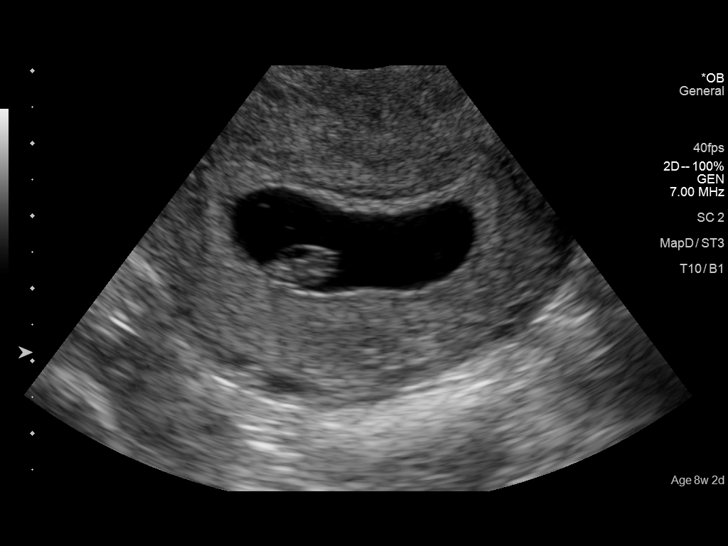
[im 15/31]
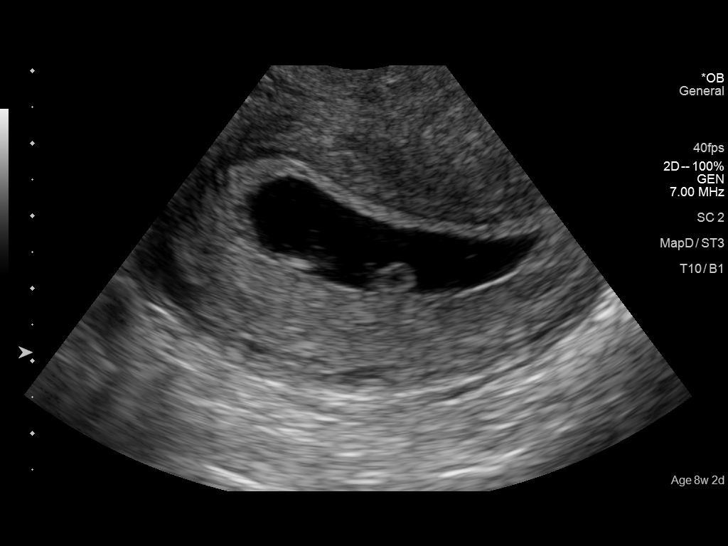
[im 17/31]
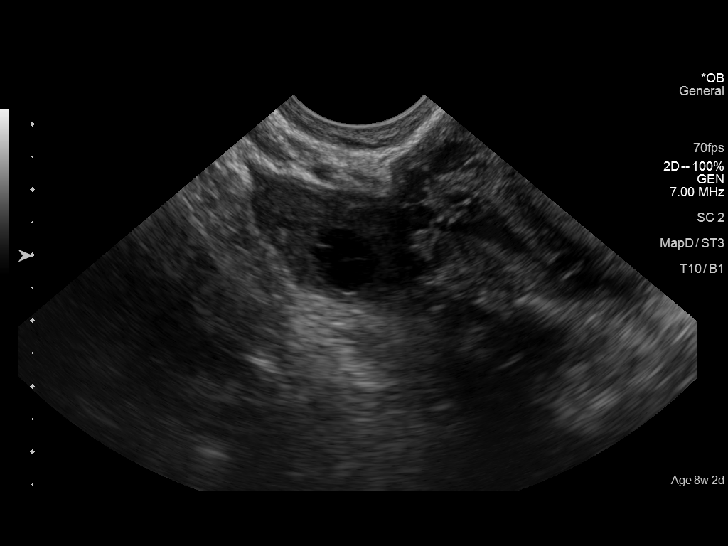
[im 19/31]
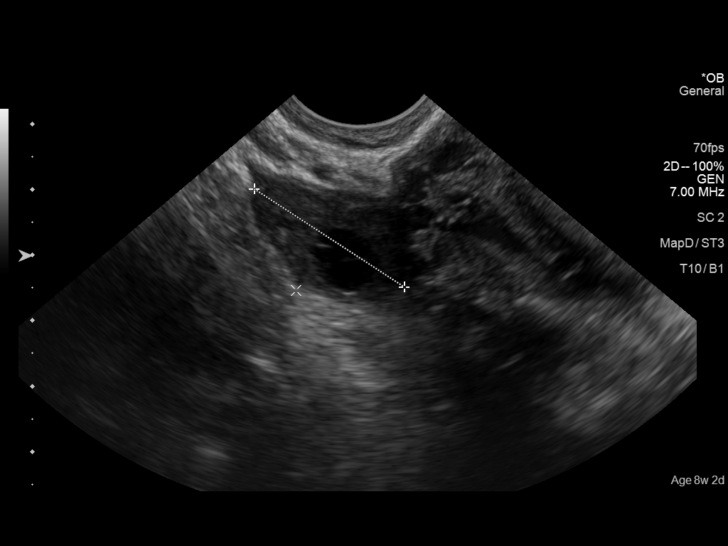
[im 22/31]
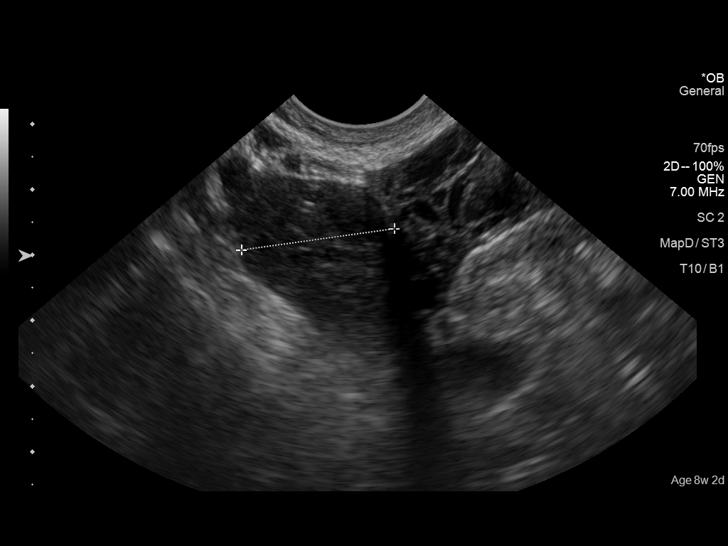
[im 24/31]
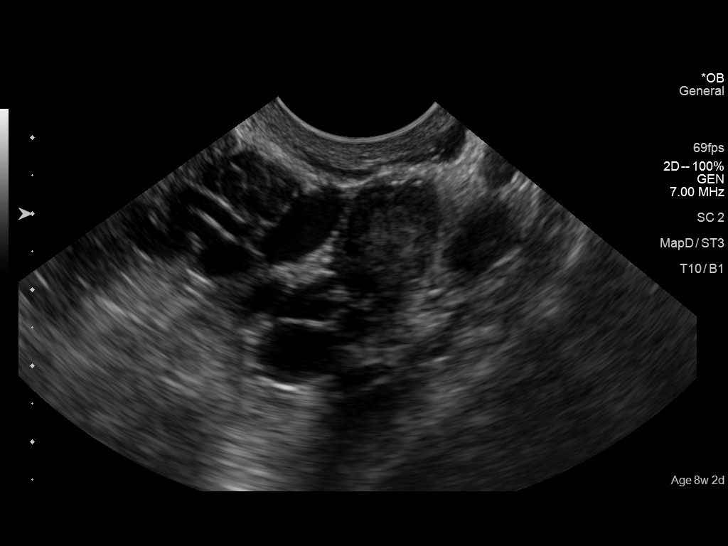
[im 26/31]
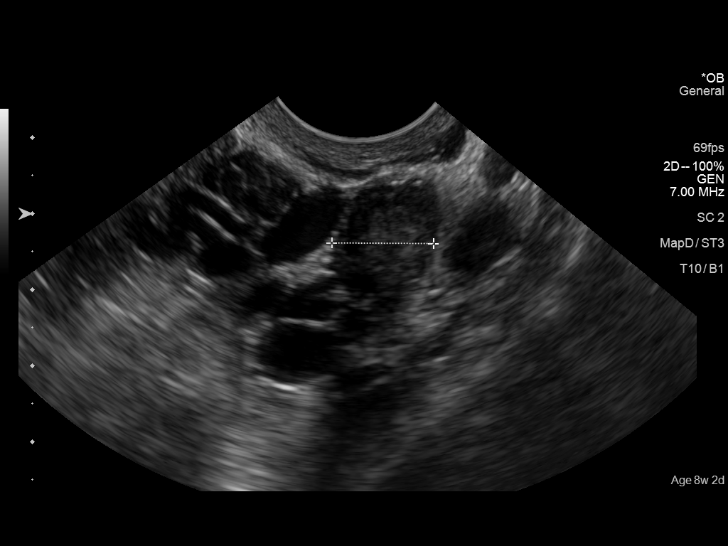
[im 28/31]
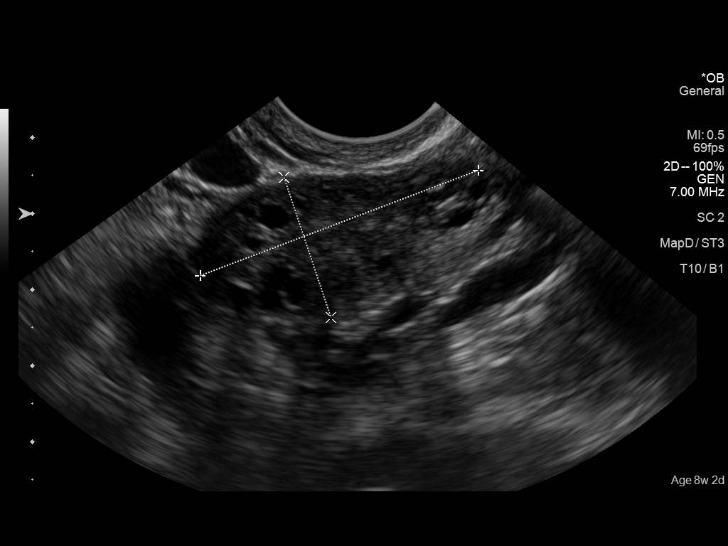
[im 31/31]
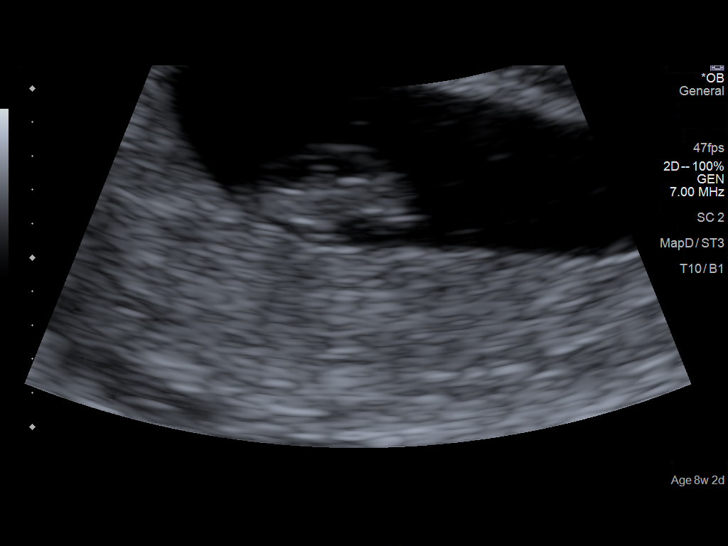

[14 of 28 positions shown; findings below may reference images not displayed]

FINDINGS: Intrauterine gestational sac: Single

Yolk sac:  Yes

Embryo:  Yes

Cardiac Activity: Yes

Heart Rate: 133  bpm

CRL:  11.9  mm   7 w   3 d                  US EDC: 12/17/2015

Subchorionic hemorrhage:  None visualized.

Maternal uterus/adnexae:

Subchorionic hemorrhage: None.

Right ovary: Normal

Left ovary: Normal

Other :None

Free fluid:  None
IMPRESSION: 1. Single living intrauterine gestation is identified. The estimated
gestational age is 7 weeks and 3 days. The gestational age by last
menstrual period is 8 weeks and 2 days.
2. No complications.

## 2017-04-20 IMAGING — US US MFM FETAL NUCHAL TRANSLUCENCY
1 series · 15 of 27 positions shown · non-contrast
Comparison: none

[Series 1: us mfm fetal nuchal translucency · 15 of 27 slices shown]
[im 1/27]
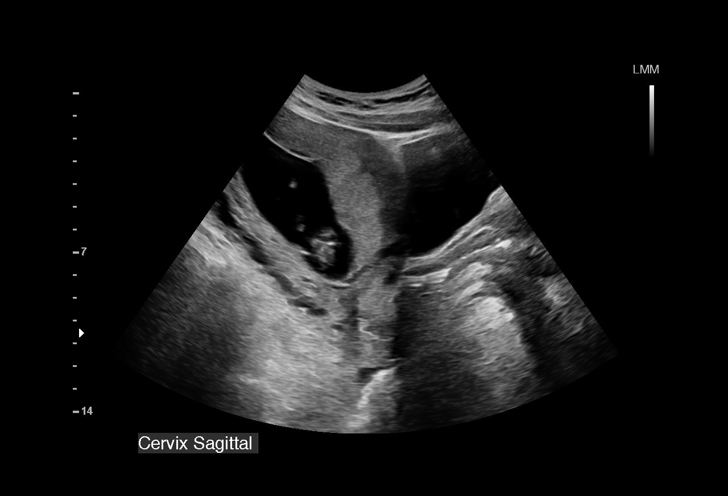
[im 3/27]
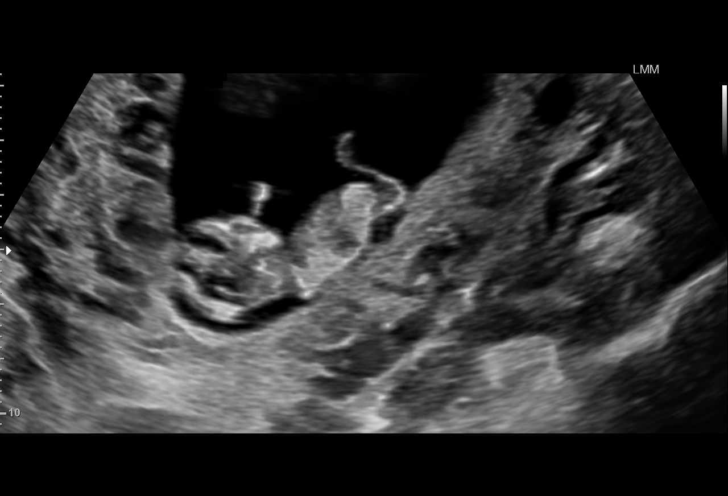
[im 5/27]
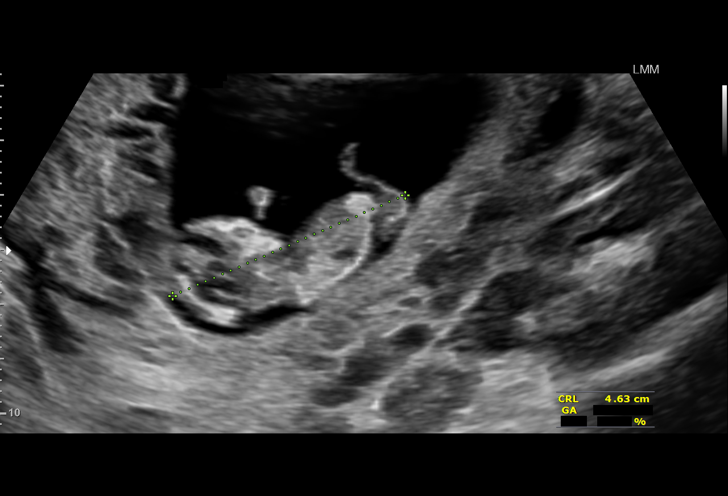
[im 7/27]
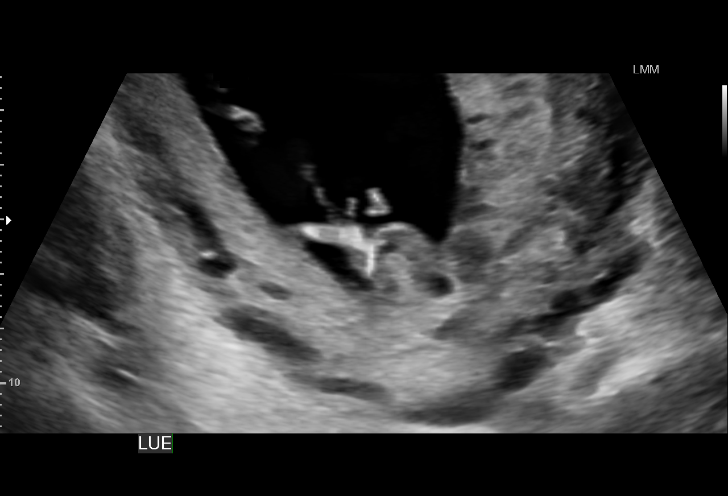
[im 9/27]
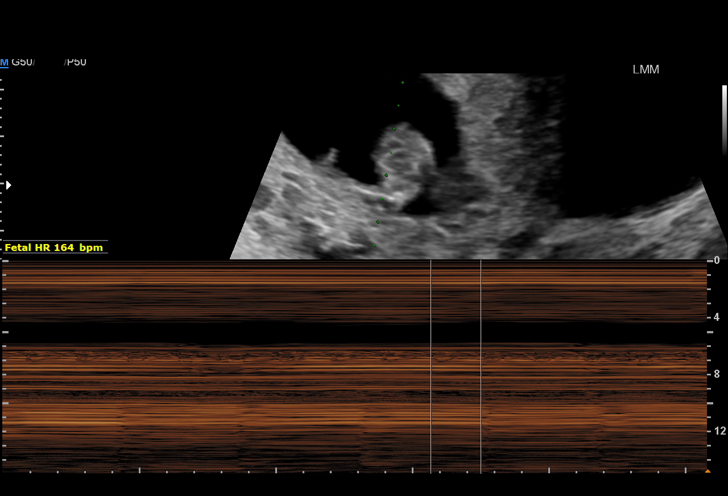
[im 10/27]
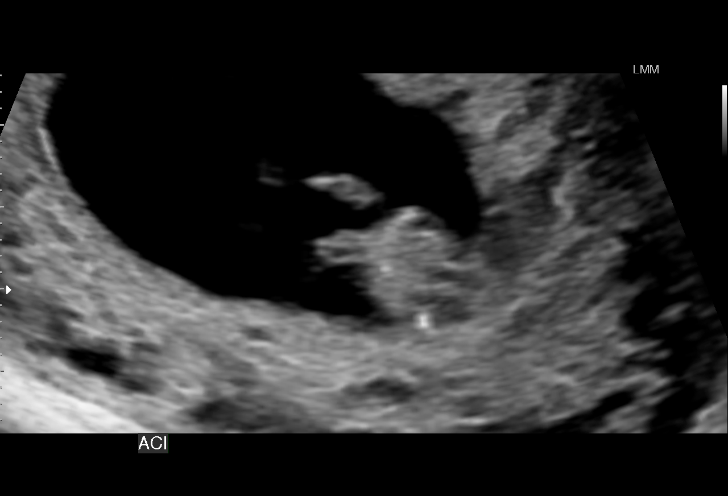
[im 12/27]
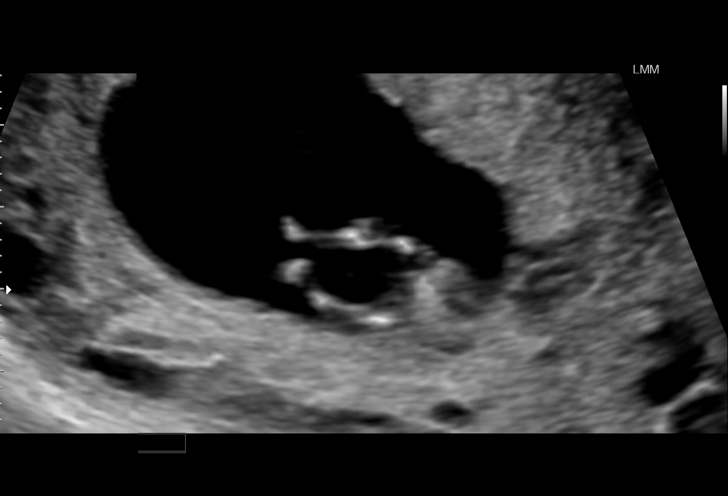
[im 14/27]
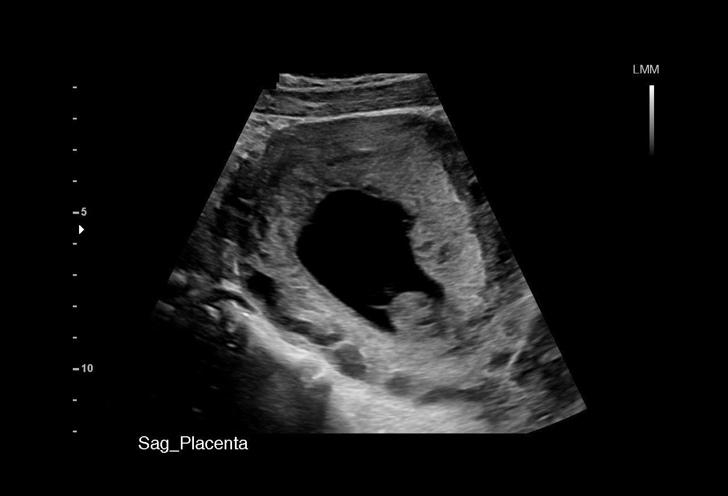
[im 16/27]
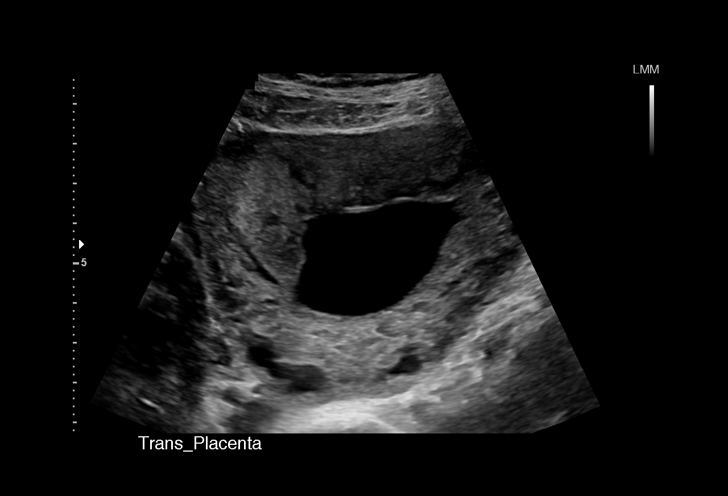
[im 18/27]
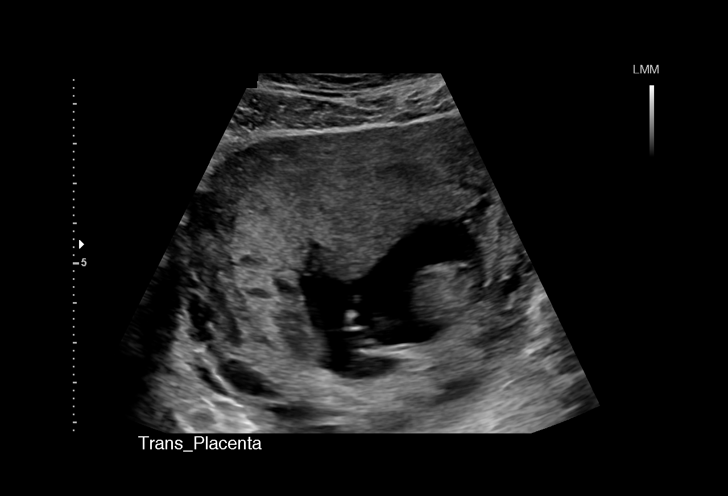
[im 19/27]
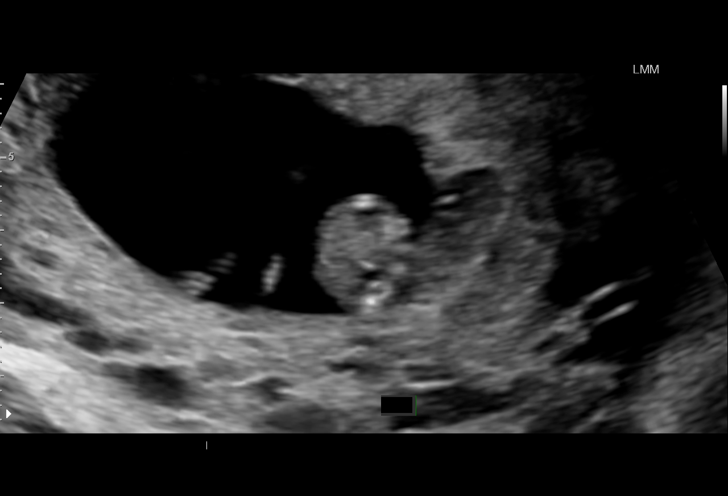
[im 21/27]
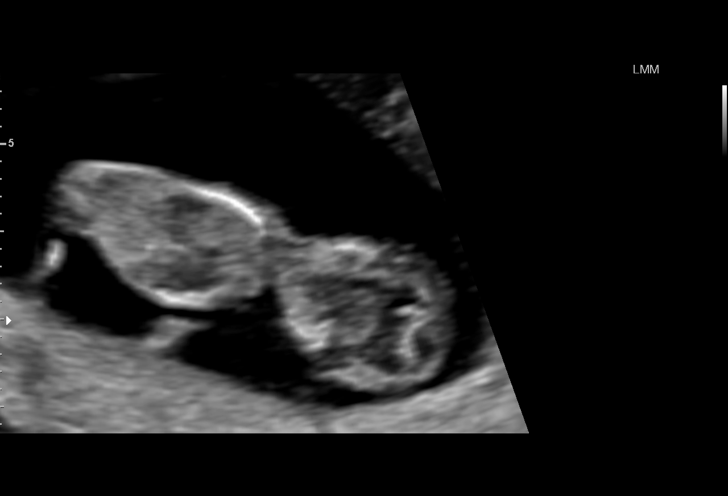
[im 23/27]
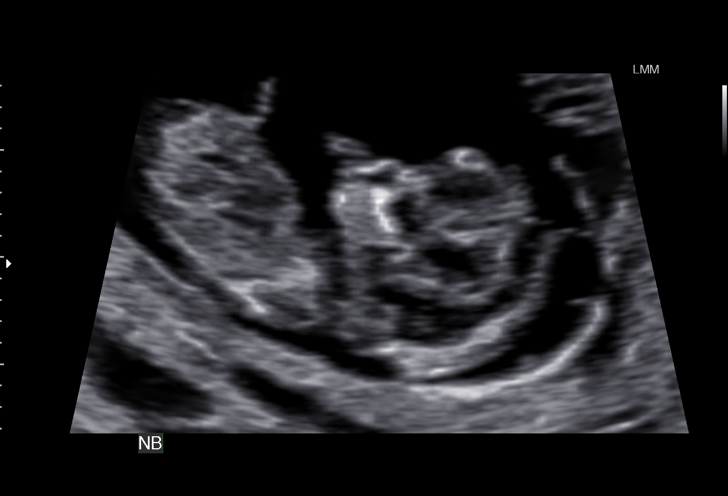
[im 25/27]
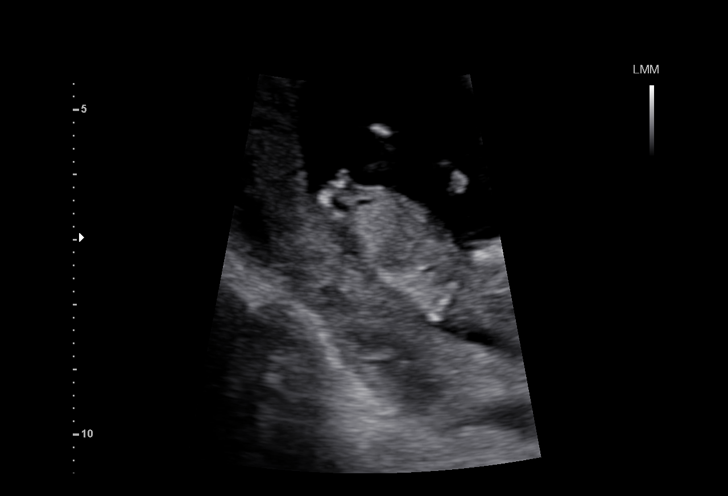
[im 27/27]
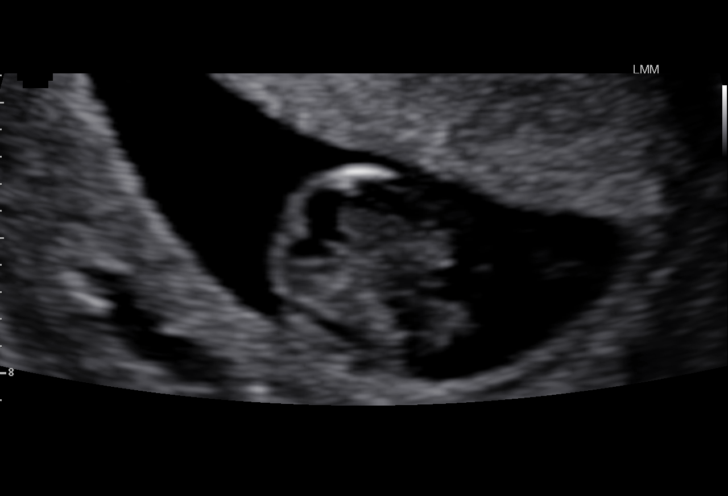

[15 of 27 positions shown; findings below may reference images not displayed]

pm)

Date:

TRANSLUCENCY

1  NESAR BARDEWA            [id number]       [id number]     [id number]
Indications

First trimester aneuploidy screen (NT)          Z36
11 weeks gestation of pregnancy
OB History

Gravidity:     2         Term:  1
Living:        1
Fetal Evaluation

Num Of Fetuses:      1
Fetal Heart          164
Rate(bpm):
Cardiac Activity:    Observed
Presentation:        Variable
Placenta:            Anterior, above cervical os
P. Cord Insertion:   Not well visualized

Amniotic Fluid
AFI FV:      Subjectively within normal limits
Biometry

CRL:      46.6  mm     G. Age:   11w 3d                  EDD:   12/19/15
Gestational Age
LMP:           12w 4d        Date:  03/06/15                  EDD:   12/11/15
Best:          11w 5d    Det. By:   Early Ultrasound          EDD:   12/17/15
(05/03/15)
Cervix Uterus Adnexa

Cervix
Normal appearance by transabdominal scan.

Uterus
No abnormality visualized.

Left Ovary
Not visualized.

Right Ovary
Not visualized.

Cul De        No free fluid seen.
Sac:

Adnexa:       No abnormality visualized.
Impression

SIUP at 11+5 weeks
No gross abnormalities identified
Unable to obtain NT measurement due to fetal position
Normal amniotic fluid volume
Measurements consistent with early US
Recommendations

Recommend follow-up ultrasound examination in 1-2 weeks
for second attempt at NT measurement

## 2017-05-02 IMAGING — US US MFM FETAL NUCHAL TRANSLUCENCY
1 series · 15 of 28 positions shown · non-contrast
Comparison: none

[Series 1: us mfm fetal nuchal translucency · 38 acquisitions, 15 frames shown]
[im 1/38]
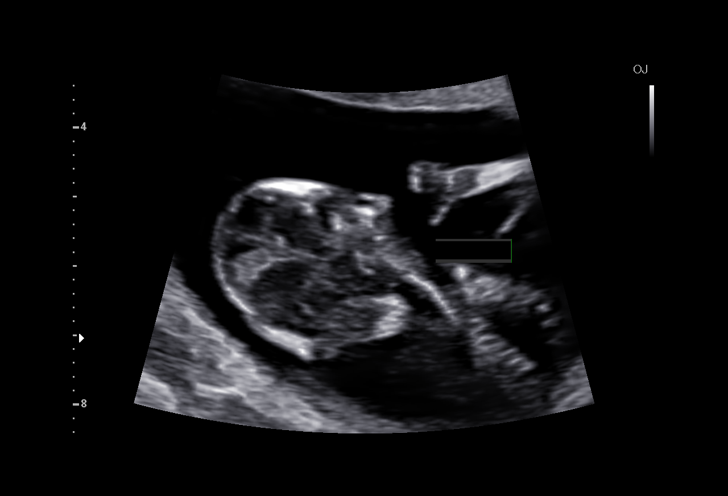
[im 3/38]
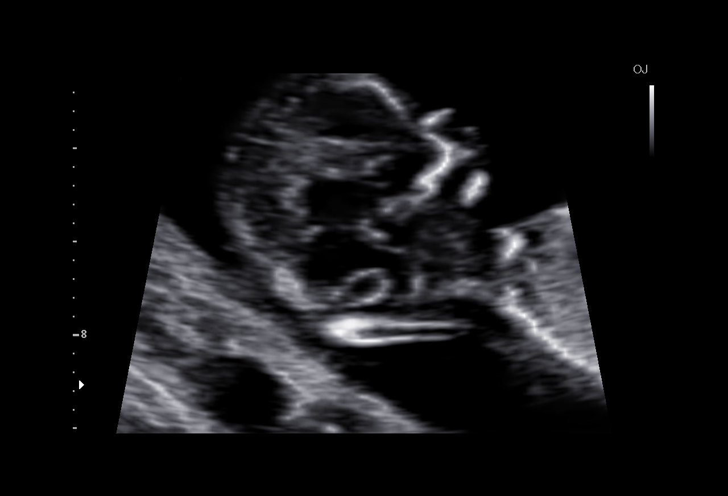
[im 6/38]
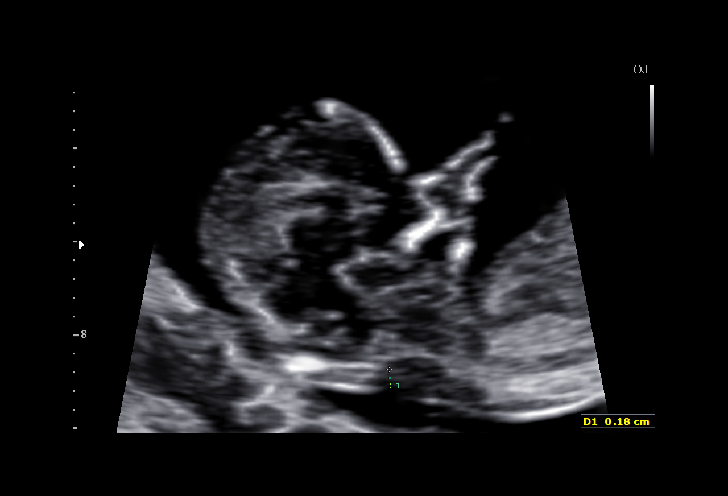
[im 9/38]
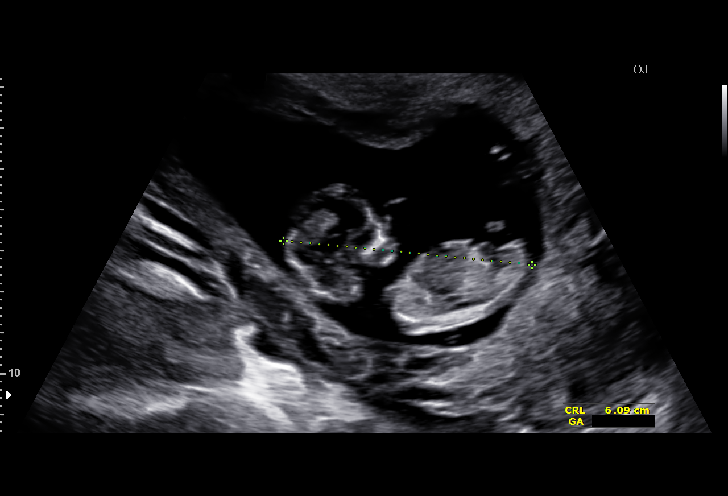
[im 11/38]
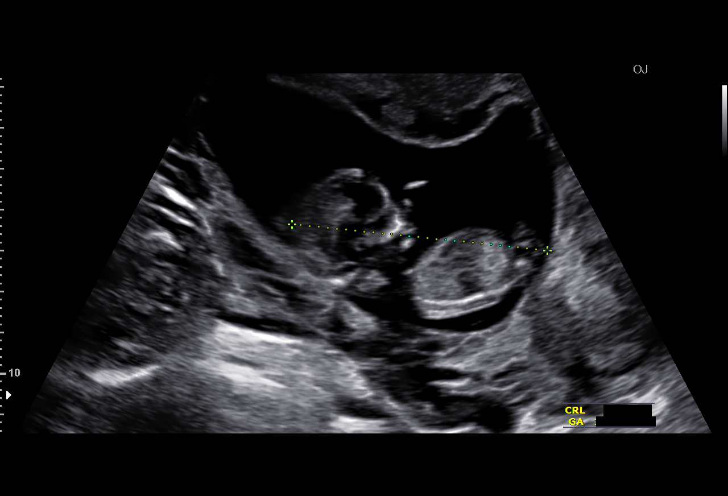
[im 14/38]
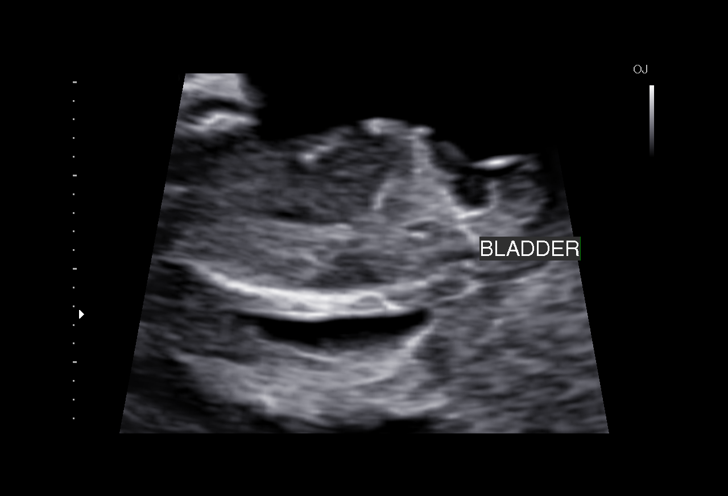
[im 17/38]
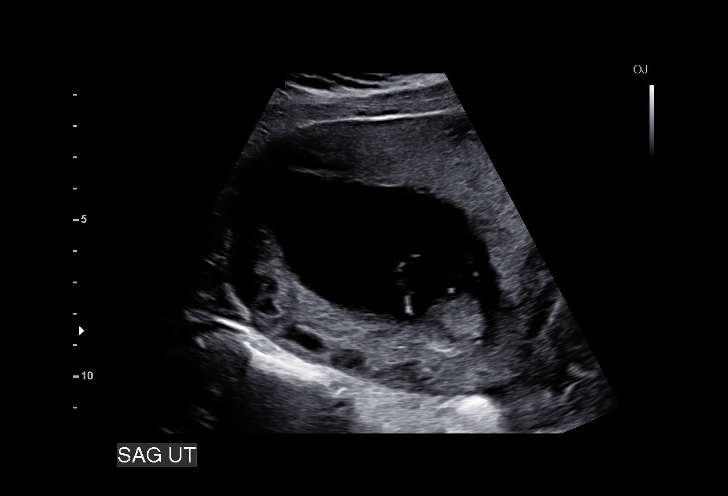
[im 20/38]
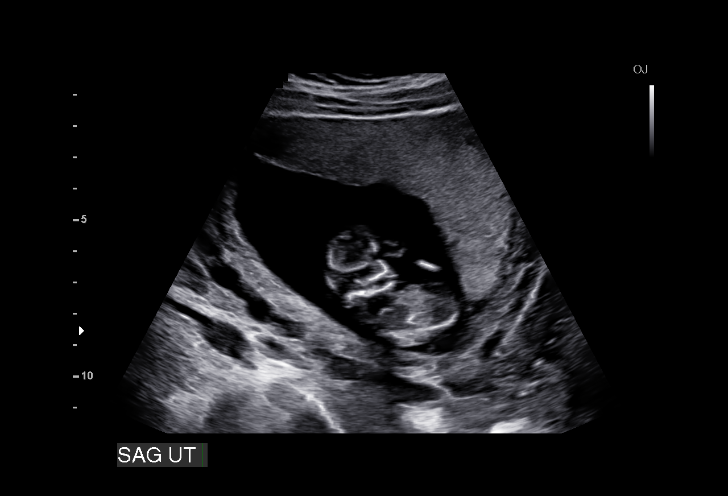
[im 21/38]
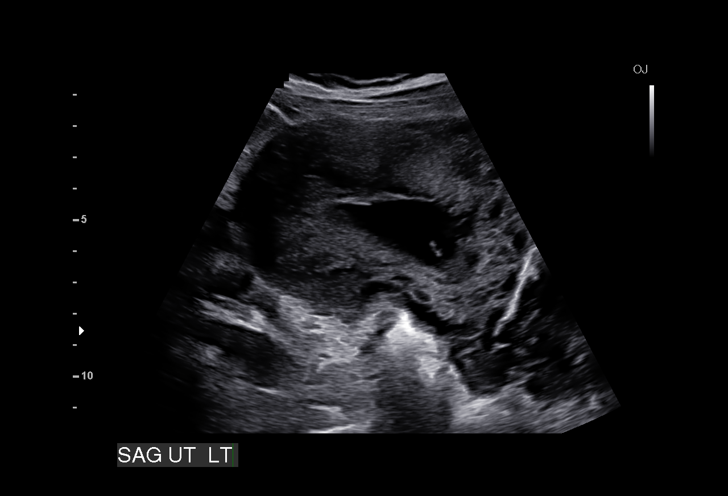
[im 24/38]
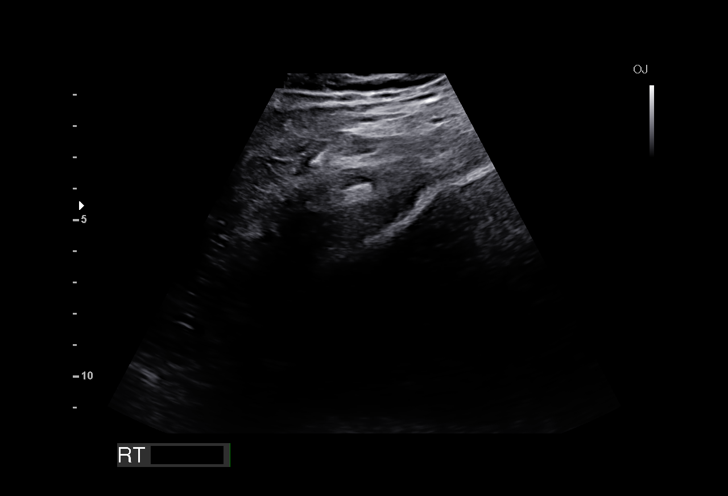
[im 27/38]
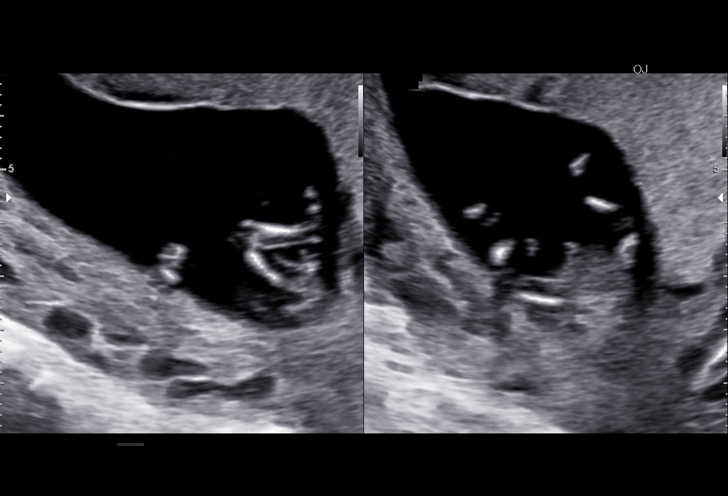
[im 29/38]
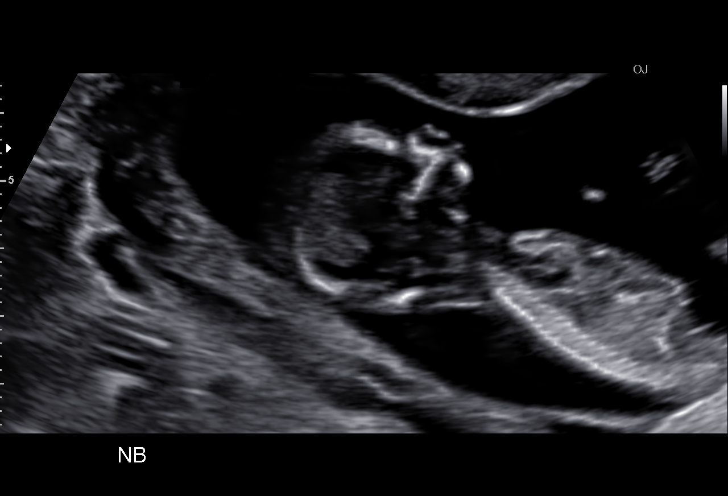
[im 32/38]
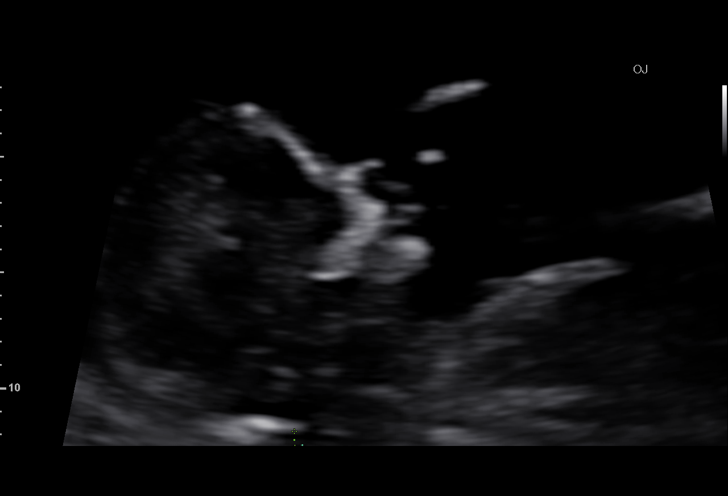
[im 35/38]
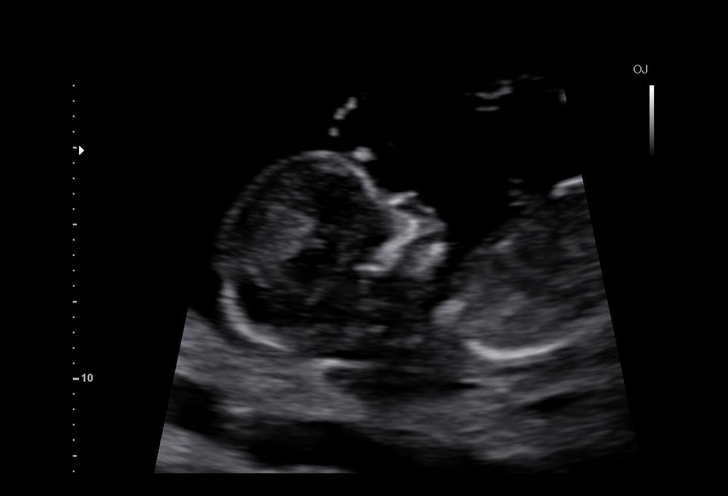
[im 38/38]
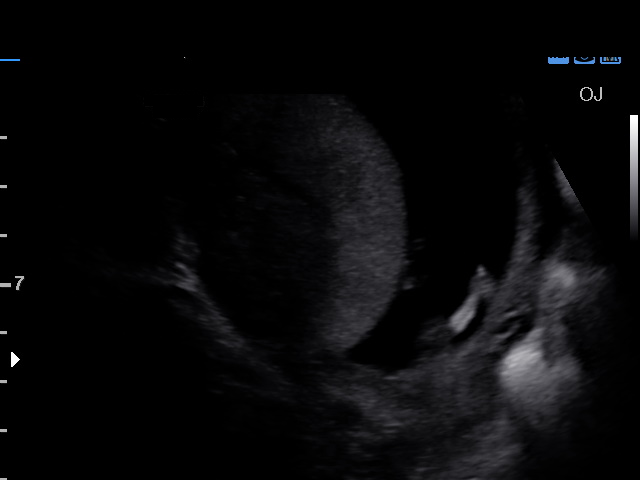

[15 of 28 positions shown; findings below may reference images not displayed]

TRANSLUCENCY

1  NOVINE ASAMBA            [id number]      [id number]     [id number]
Indications

13 weeks gestation of pregnancy
First trimester aneuploidy screen (NT)         Z36
OB History

Gravidity:    2         Term:   1
Living:       1
Fetal Evaluation

Num Of Fetuses:     1
Preg. Location:     Intrauterine
Gest. Sac:          Intrauterine
Fetal Pole:         Visualized
Fetal Heart         159
Rate(bpm):
Cardiac Activity:   Observed
Presentation:       Variable

Amniotic Fluid
AFI FV:      Subjectively within normal limits
Gestational Age

LMP:           14w 2d        Date:  03/06/15                 EDD:    12/11/15
Best:          13w 3d     Det. By:  Early Ultrasound         EDD:    12/17/15
(05/03/15)
1st Trimester Genetic Sonogram Screening

CRL:            62.6  mm    G. Age:   12w 4d                 EDD:    12/23/15
Nuc Trans:       1.4  mm
Nasal Bone:                 Present
Anatomy

Cranium:          Appears normal         Cord Vessels:     Appears normal (3
vessel cord)
Choroid Plexus:   Appears normal         Bladder:          Appears normal
Cervix Uterus Adnexa

Left Ovary
Size(cm)     2.71   x   2.01   x  1.19      Vol(ml):
Impression

SIUP at 13+3 weeks
No gross abnormalities identified
NT measurement was within normal limits for this GA; NB
present
Normal amniotic fluid volume
Measurements consistent with prior US
Recommendations

Offer MSAFP in the second trimester for ONTD screening
Offer anatomy U/S by 18 weeks
